# Patient Record
Sex: Female | Born: 1965 | Race: Black or African American | Marital: Married | Smoking: Never smoker
Health system: Southern US, Community
[De-identification: ages and names within clinical notes are randomized; demographics above are authoritative.]

## PROBLEM LIST (undated history)

## (undated) DIAGNOSIS — I82409 Acute embolism and thrombosis of unspecified deep veins of unspecified lower extremity: Secondary | ICD-10-CM

## (undated) HISTORY — DX: Acute embolism and thrombosis of unspecified deep veins of unspecified lower extremity: I82.409

---

## 2012-07-25 ENCOUNTER — Ambulatory Visit
Admission: RE | Admit: 2012-07-25 | Discharge: 2012-07-25 | Disposition: A | Discharge status: Home / Self Care | Payer: No Typology Code available for payment source | Source: Ambulatory Visit | Attending: Specialist | Admitting: Specialist

## 2012-07-25 ENCOUNTER — Other Ambulatory Visit: Payer: Self-pay | Admitting: Specialist

## 2012-07-25 DIAGNOSIS — R7611 Nonspecific reaction to tuberculin skin test without active tuberculosis: Secondary | ICD-10-CM

## 2012-08-18 DIAGNOSIS — I82409 Acute embolism and thrombosis of unspecified deep veins of unspecified lower extremity: Secondary | ICD-10-CM

## 2012-08-18 HISTORY — PX: OTHER SURGICAL HISTORY: SHX169

## 2012-08-18 HISTORY — DX: Acute embolism and thrombosis of unspecified deep veins of unspecified lower extremity: I82.409

## 2012-09-14 ENCOUNTER — Inpatient Hospital Stay (HOSPITAL_COMMUNITY)
Admission: EM | Admit: 2012-09-14 | Discharge: 2012-09-17 | DRG: 253 | Disposition: A | Discharge status: Home Health | Payer: Medicaid Other | Attending: Internal Medicine | Admitting: Internal Medicine

## 2012-09-14 ENCOUNTER — Emergency Department (HOSPITAL_COMMUNITY): Payer: Medicaid Other

## 2012-09-14 ENCOUNTER — Encounter (HOSPITAL_COMMUNITY): Payer: Self-pay | Admitting: Emergency Medicine

## 2012-09-14 DIAGNOSIS — M7989 Other specified soft tissue disorders: Secondary | ICD-10-CM

## 2012-09-14 DIAGNOSIS — I871 Compression of vein: Principal | ICD-10-CM | POA: Diagnosis present

## 2012-09-14 DIAGNOSIS — I82409 Acute embolism and thrombosis of unspecified deep veins of unspecified lower extremity: Secondary | ICD-10-CM

## 2012-09-14 DIAGNOSIS — I824Z9 Acute embolism and thrombosis of unspecified deep veins of unspecified distal lower extremity: Secondary | ICD-10-CM | POA: Diagnosis present

## 2012-09-14 DIAGNOSIS — I82429 Acute embolism and thrombosis of unspecified iliac vein: Secondary | ICD-10-CM | POA: Diagnosis present

## 2012-09-14 DIAGNOSIS — I824Y9 Acute embolism and thrombosis of unspecified deep veins of unspecified proximal lower extremity: Secondary | ICD-10-CM | POA: Diagnosis present

## 2012-09-14 DIAGNOSIS — E876 Hypokalemia: Secondary | ICD-10-CM | POA: Diagnosis present

## 2012-09-14 LAB — APTT: aPTT: 24 seconds (ref 24–37)

## 2012-09-14 LAB — COMPREHENSIVE METABOLIC PANEL
ALT: 14 U/L (ref 0–35)
AST: 18 U/L (ref 0–37)
Albumin: 3.4 g/dL — ABNORMAL LOW (ref 3.5–5.2)
CO2: 22 mEq/L (ref 19–32)
Calcium: 8.6 mg/dL (ref 8.4–10.5)
GFR calc non Af Amer: 88 mL/min — ABNORMAL LOW (ref >90)
Sodium: 134 mEq/L — ABNORMAL LOW (ref 135–145)
Total Protein: 7.1 g/dL (ref 6.0–8.3)

## 2012-09-14 LAB — CBC WITH DIFFERENTIAL/PLATELET
Basophils Absolute: 0 10*3/uL (ref 0.0–0.1)
Basophils Relative: 0 % (ref 0–1)
Eosinophils Relative: 1 % (ref 0–5)
Lymphocytes Relative: 29 % (ref 12–46)
MCV: 75 fL — ABNORMAL LOW (ref 78.0–100.0)
Platelets: 284 10*3/uL (ref 150–400)
RDW: 19.8 % — ABNORMAL HIGH (ref 11.5–15.5)
WBC: 4.6 10*3/uL (ref 4.0–10.5)

## 2012-09-14 MED ORDER — HEPARIN (PORCINE) IN NACL 100-0.45 UNIT/ML-% IJ SOLN
12.0000 [IU]/kg/h | INTRAMUSCULAR | Status: DC
  Administered 2012-09-14: 12 [IU]/kg/h via INTRAVENOUS
  Filled 2012-09-14: qty 250

## 2012-09-14 MED ORDER — HEPARIN BOLUS VIA INFUSION
4000.0000 [IU] | Freq: Once | INTRAVENOUS | Status: AC
  Administered 2012-09-14: 4000 [IU] via INTRAVENOUS

## 2012-09-14 MED ORDER — MORPHINE SULFATE 4 MG/ML IJ SOLN
4.0000 mg | Freq: Once | INTRAMUSCULAR | Status: DC
  Filled 2012-09-14 (×2): qty 1

## 2012-09-14 MED ORDER — DEXTROSE 5 % IV SOLN
2.0000 ug/min | INTRAVENOUS | Status: DC
  Filled 2012-09-14: qty 4

## 2012-09-14 MED ORDER — ONDANSETRON HCL 4 MG/2ML IJ SOLN
4.0000 mg | Freq: Once | INTRAMUSCULAR | Status: DC
  Filled 2012-09-14 (×2): qty 2

## 2012-09-14 MED ORDER — SODIUM CHLORIDE 0.9 % IV BOLUS (SEPSIS)
1000.0000 mL | Freq: Once | INTRAVENOUS | Status: AC
  Administered 2012-09-14: 1000 mL via INTRAVENOUS

## 2012-09-14 MED ORDER — POTASSIUM CHLORIDE CRYS ER 20 MEQ PO TBCR
20.0000 meq | EXTENDED_RELEASE_TABLET | Freq: Three times a day (TID) | ORAL | Status: AC
  Administered 2012-09-14 – 2012-09-15 (×2): 20 meq via ORAL
  Filled 2012-09-14 (×3): qty 1

## 2012-09-14 MED ORDER — FAMOTIDINE IN NACL 20-0.9 MG/50ML-% IV SOLN
20.0000 mg | Freq: Two times a day (BID) | INTRAVENOUS | Status: DC
  Administered 2012-09-15: 20 mg via INTRAVENOUS
  Filled 2012-09-14 (×2): qty 50

## 2012-09-14 MED ORDER — HEPARIN (PORCINE) IN NACL 100-0.45 UNIT/ML-% IJ SOLN
900.0000 [IU]/h | INTRAMUSCULAR | Status: DC
  Filled 2012-09-14: qty 250

## 2012-09-14 MED ORDER — IOHEXOL 350 MG/ML SOLN
100.0000 mL | Freq: Once | INTRAVENOUS | Status: AC | PRN
  Administered 2012-09-14: 100 mL via INTRAVENOUS

## 2012-09-14 MED ORDER — SODIUM CHLORIDE 0.9 % IV SOLN
INTRAVENOUS | Status: DC
  Administered 2012-09-14 – 2012-09-16 (×4): via INTRAVENOUS

## 2012-09-14 MED ORDER — SODIUM CHLORIDE 0.9 % IV SOLN: 250.0000 mL | INTRAVENOUS | Status: DC | PRN

## 2012-09-14 NOTE — ED Provider Notes (Signed)
History     CSN: [id number]  Arrival date & time 09/14/12  1450   First MD Initiated Contact with Patient 09/14/12 1543      Chief Complaint  Patient presents with  . Leg Swelling    Possible insect bite.    (Consider location/radiation/quality/duration/timing/severity/associated sxs/prior treatment) The history is provided by the patient, the spouse, a relative and medical records.    Kirsten Edwards is a 46 y.o. female presents to the emergency department complaining of swelling of the Leg.  The onset of the symptoms was  abrupt starting 2 hours ago.  The patient has associated redness, warmth and shortness of breath, nausea, lightheaded, blurry vision.  The symptoms have been  persistent, rapidly worsened.  nothing makes the symptoms worse and nothing makes symptoms better.  The patient denies fever, chills, headache, neck pain, abdominal pain, dizzy, changes in vision.  Patient speak [place]; her family translating.  States patient stepped outside to talk to toss out a bucket of water. She did step into the grass.  She denies any sort of bite.  Immediately afterwards she began to have swelling of the left leg. Family states that her toes turned black initially and swelling began in the toes and has migrated up her left leg.  She complains about her leg feeling heavy, pain to palpation.  Patient denies falling or trauma.  No Hx of clotting disorders.    History reviewed. No pertinent past medical history.  History reviewed. No pertinent past surgical history.  No family history on file.  History  Substance Use Topics  . Smoking status: Not on file  . Smokeless tobacco: Never Used  . Alcohol Use: No    OB History    Grav Para Term Preterm Abortions TAB SAB Ect Mult Living                  Review of Systems  Constitutional: Negative for fever, diaphoresis, appetite change, fatigue and unexpected weight change.  HENT: Negative for mouth sores and neck stiffness.   Eyes:  Negative for visual disturbance.  Respiratory: Positive for shortness of breath. Negative for cough, chest tightness and wheezing.   Cardiovascular: Positive for leg swelling. Negative for chest pain.  Gastrointestinal: Positive for nausea. Negative for vomiting, abdominal pain, diarrhea and constipation.  Genitourinary: Negative for dysuria, urgency, frequency and hematuria.  Musculoskeletal: Positive for myalgias, joint swelling, arthralgias and gait problem. Negative for back pain.  Skin: Negative for rash.  Neurological: Negative for syncope, light-headedness and headaches.  Psychiatric/Behavioral: Negative for disturbed wake/sleep cycle. The patient is not nervous/anxious.   All other systems reviewed and are negative.    Allergies  Review of patient's allergies indicates no known allergies.  Home Medications   Current Outpatient Rx  Name Route Sig Dispense Refill  . RIFAMPIN 300 MG PO CAPS Oral Take 300 mg by mouth daily.      BP 153/109  Pulse 76  Temp 97.7 F (36.5 C) (Oral)  Resp 22  SpO2 100%  LMP 09/14/2012  Physical Exam  Nursing note and vitals reviewed. Constitutional: She appears well-developed and well-nourished. No distress.  HENT:  Head: Normocephalic and atraumatic.  Mouth/Throat: Oropharynx is clear and moist. No oropharyngeal exudate.  Eyes: Conjunctivae normal are normal. Pupils are equal, round, and reactive to light. No scleral icterus.  Neck: Normal range of motion. Neck supple.  Cardiovascular: Normal rate, regular rhythm, normal heart sounds and intact distal pulses.  Exam reveals no gallop and no friction  rub.   No murmur heard.      Normal heart rate 66 on evaluation.  Patient tachycardic to 133 in triage.  Pulmonary/Chest: Effort normal and breath sounds normal. No respiratory distress. She has no wheezes.  Abdominal: Soft. Bowel sounds are normal. She exhibits no mass. There is no tenderness. There is no rebound and no guarding.    Musculoskeletal: She exhibits edema (L leg from groin to toes) and tenderness (entire left leg).       Decreased range of motion of the left leg secondary to swelling and pain.  Lymphadenopathy:    She has no cervical adenopathy.  Neurological: She is alert.       Speech is clear and goal oriented Moves extremities without ataxia  Skin: Skin is warm and dry. No rash noted. She is not diaphoretic. There is erythema (left leg).       Superficial varicosities noted in the left thigh. No hives  Psychiatric: She has a normal mood and affect.    ED Course  Procedures (including critical care time)  Labs Reviewed  CBC WITH DIFFERENTIAL - Abnormal; Notable for the following:    Hemoglobin 11.2 (*)     HCT 35.1 (*)     MCV 75.0 (*)     MCH 23.9 (*)     RDW 19.8 (*)     All other components within normal limits  COMPREHENSIVE METABOLIC PANEL - Abnormal; Notable for the following:    Sodium 134 (*)     Potassium 3.2 (*)     Glucose, Bld 158 (*)     Albumin 3.4 (*)     GFR calc non Af Amer 88 (*)     All other components within normal limits  PROTIME-INR  APTT   Dg Chest 2 View  09/14/2012  *RADIOLOGY REPORT*  Clinical Data: Leg swelling, ankle swelling  CHEST - 2 VIEW  Comparison: None.  Findings: Normal mediastinum and cardiac silhouette.  Normal pulmonary  vasculature.  No evidence of effusion, infiltrate, or pneumothorax.  No acute bony abnormality.  IMPRESSION: No acute cardiopulmonary process.   Original Report Authenticated By: Genevive Bi, M.D.    Ct Angio Chest Pe W/cm &/or Wo Cm  09/14/2012  *RADIOLOGY REPORT*  Clinical Data: Short of breath, concern for pulmonary embolism, hypotensive  CT ANGIOGRAPHY CHEST  Technique:  Multidetector CT imaging of the chest using the standard protocol during bolus administration of intravenous contrast. Multiplanar reconstructed images including MIPs were obtained and reviewed to evaluate the vascular anatomy.  Contrast:  100 ml Omnipaque  300  Comparison: Chest radiograph 09/14/2012  Findings: There are no filling defects within the pulmonary arteries to suggest acute pulmonary embolism.  No acute findings of the aorta or great vessels.  No pericardial fluid.  Esophagus is normal.  No axillary or supraclavicular lymphadenopathy.  No mediastinal lymphadenopathy.  Review of the lung parenchyma demonstrates no pneumothorax, pleural fluid, or pulmonary edema.  Limited view of the upper abdomen is unremarkable.  Limited view of the skeleton is unremarkable.  IMPRESSION:  1.  No evidence of pulmonary embolism.  2.  No evidence of dissection.  3.  No pneumothorax.   Original Report Authenticated By: Genevive Bi, M.D.     ECG:  Date: 09/14/2012  Rate: 66  Rhythm: normal sinus rhythm  QRS Axis: normal  Intervals: normal  ST/T Wave abnormalities: normal  Conduction Disutrbances:none  Narrative Interpretation: NSR  Old EKG Reviewed: none available    1. DVT (deep venous  thrombosis)    CRITICAL CARE Performed by: Dierdre Forth   Total critical care time: 45  Critical care time was exclusive of separately billable procedures and treating other patients.  Critical care was necessary to treat or prevent imminent or life-threatening deterioration.  Critical care was time spent personally by me on the following activities: development of treatment plan with patient and/or surrogate as well as nursing, discussions with consultants, evaluation of patient's response to treatment, examination of patient, obtaining history from patient or surrogate, ordering and performing treatments and interventions, ordering and review of laboratory studies, ordering and review of radiographic studies, pulse oximetry and re-evaluation of patient's condition.    MDM  Roland Earl resents with unilateral leg swelling. Concern for venous occlusion.  Shortness of breath will look for PE with CT angiogram. Ct angio chest negative for PE.  Venous  duplex: There is acute, extensive, occlusive DVT noted in the left posterior tibial, peroneal, popliteal, femoral and common femoral veins. There is occlusive acute DVT noted in the left greater saphenous vein at origin to mid thigh. There is no propagation to the right lower extremity, noted.  Case discussed with Vascular Surgery who would like IR consulted.  Other labs, including coagulation panel unremarkable.  Pt to be admitted to Critical Care at [facility] with IR procedure.    Dr. Gerhard Munch was consulted, evaluated this patient with me and agrees with the plan.            Dahlia Client Brina Umeda, PA-C 09/14/12 2019

## 2012-09-14 NOTE — Progress Notes (Signed)
12:03 AM  09/15/2012  [facility] MD notified of patient's arrival. Request made to notify IR. IR Notified. E link MD and IR informed that patient does not speak english. Family is at bedside and is able to assist in making patients needs known.   Sherle Poe RN

## 2012-09-14 NOTE — ED Notes (Signed)
[facility] en route.  °

## 2012-09-14 NOTE — ED Notes (Signed)
Report given to ICU RN

## 2012-09-14 NOTE — ED Notes (Signed)
[facility] called for

## 2012-09-14 NOTE — ED Notes (Signed)
Pt. Family members given cup of coffee.

## 2012-09-14 NOTE — ED Notes (Signed)
Pattie, RN at bedside.

## 2012-09-14 NOTE — Progress Notes (Signed)
VASCULAR LAB PRELIMINARY  PRELIMINARY  PRELIMINARY  PRELIMINARY  Left lower extremity venous Doppler completed.    Preliminary report:  There is acute, extensive, occlusive DVT noted in the left posterior tibial, peroneal, popliteal, femoral and common femoral veins.  There is occlusive acute SVT noted in the left greater saphenous vein at origin to mid thigh. There is no propagation to the right lower extremity, noted. Kirsten Edwards, 09/14/2012, 6:23 PM

## 2012-09-14 NOTE — H&P (Signed)
Name: Kirsten Edwards MRN: [id number] DOB: [birth year]    LOS: 0  PULMONARY / CRITICAL CARE MEDICINE  HPI:   46 years old female with no significant PMH. Originally from [place]. Does not speak english, medical history taken by ED physicians from her husband. Apparently she was in her usual state of health until 4:00 pm today when she experienced rapid progressively worse left lower extremity edema. Denies fever, CP, SOB, N/V/D. At admission to the ED and at the time of my examination the patient is awake, alert and hemodynamically stable. CT angiogram negative for PE and normal lung parenchyma. LE dopplers showed extensive, occlusive DVT noted in the left posterior tibial, peroneal, popliteal, femoral and common femoral veins. There is also occlusive acute DVT noted in the left greater saphenous vein at origin to mid thigh. IR was consulted for possible interventional approach. Critical care consulted for admission to the ICU at [facility].     History reviewed. No pertinent past medical history. History reviewed. No pertinent past surgical history. Prior to Admission medications   Medication Sig Start Date End Date Taking? Authorizing Provider  rifampin (RIFADIN) 300 MG capsule Take 300 mg by mouth daily.   Yes Historical Provider, MD   Allergies No Known Allergies  Family History No family history on file. Social History  does not have a smoking history on file. She has never used smokeless tobacco. She reports that she does not drink alcohol or use illicit drugs.  Review Of Systems:  Negative except for what I mentioned in the HPI.   Current Status:  Vital Signs: Temp:  [97.7 F (36.5 C)-98.3 F (36.8 C)] 98.3 F (36.8 C) (09/28 2209) Pulse Rate:  [61-133] 69  (09/28 2209) Resp:  [16-22] 17  (09/28 2209) BP: (73-153)/(47-109) 122/80 mmHg (09/28 2209) SpO2:  [97 %-100 %] 100 % (09/28 2209) Weight:  [150 lb (68.04 kg)] 150 lb (68.04 kg) (09/28 1830)  Physical Examination: General:  No  acute distress Neuro:  Awake, alert, nonfocal HEENT:  PERRL, pink conjunctivae, moist membranes Neck:  Supple, no JVD   Cardiovascular:  RRR, no M/R/G Lungs:  CTA, no W/R/R Abdomen:  Soft, nontender, nondistended, bowel sounds present Musculoskeletal:  Moves all extremities, LLE is slightly cold, edema up to the thigh. Posterior tibial and dorsalis pedis pulses present.  Skin:  No rash  Active Problems:  * No active hospital problems. *    ASSESSMENT AND PLAN  PULMONARY No results found for this basename: PHART:5,PCO2:5,PCO2ART:5,PO2ART:5,HCO3:5,O2SAT:5 in the last 168 hours  A:   1) No PE on CTA 2) No respiratory issues. P:   - O2 via [place] PRN  CARDIOVASCULAR No results found for this basename: TROPONINI:5,LATICACIDVEN:5, O2SATVEN:5,PROBNP:5 in the last 168 hours ECG:  NSR Lines: Peripheral lines  A:  1) Acute LLE DVT with significant clot burden 2) No evidence of compartment syndrome. P:  - Heparin drip - Evaluation by IR for possible embolectomy / catheter directed TPA - Will start coumadin in am - Hypercoagulable panel ordered.  RENAL  Lab 09/14/12 1625  NA 134*  K 3.2*  CL 100  CO2 22  BUN 6  CREATININE 0.80  CALCIUM 8.6  MG --  PHOS --   Intake/Output    None     A:   1) Hypokalemia P:   Will replenish K+ orally  GASTROINTESTINAL  Lab 09/14/12 1625  AST 18  ALT 14  ALKPHOS 55  BILITOT 0.3  PROT 7.1  ALBUMIN 3.4*    A:  1) No issues. P:   1) Pepcid BID  HEMATOLOGIC  Lab 09/14/12 1625  HGB 11.2*  HCT 35.1*  PLT 284  INR 1.08  APTT 24   A:   1) No issues P:  - Monitor CBC  INFECTIOUS  Lab 09/14/12 1625  WBC 4.6  PROCALCITON --    A:   1) No evidence of infection P:   - No antibiotics  ENDOCRINE No results found for this basename: GLUCAP:5 in the last 168 hours A:   1) No history of DM     NEUROLOGIC  A:   1) No issues   BEST PRACTICE / DISPOSITION - Level of Care:  ICU - Primary Service:  PCCM -  Consultants:  IR - Code Status:  Full code - Diet:  NPO - DVT Px:  Heparin drip - GI Px:  Pepcid - Skin Integrity:  Intact   The patient is critically ill with multiple organ systems failure and requires high complexity decision making for assessment and support, frequent evaluation and titration of therapies, application of advanced monitoring technologies and extensive interpretation of multiple databases.   Critical Care Time devoted to patient care services described in this note is: 1 Hour  Overton Mam, M.D. Pulmonary and Critical Care Medicine [facility] Pager: [phone]  09/14/2012, 10:45 PM

## 2012-09-14 NOTE — ED Notes (Signed)
Bed:RESB<BR> Expected date:<BR> Expected time:<BR> Means of arrival:<BR> Comments:<BR>

## 2012-09-14 NOTE — Progress Notes (Signed)
ANTICOAGULATION CONSULT NOTE - Initial Consult  Pharmacy Consult for Heparin Indication: DVT  No Known Allergies  Patient Measurements: Height: 5\' 4"  (162.6 cm) Weight: 150 lb (68.04 kg) IBW/kg (Calculated) : 54.7  Heparin Dosing Weight:   Vital Signs: Temp: 97.7 F (36.5 C) (09/28 1457) Temp src: Oral (09/28 1457) BP: 153/109 mmHg (09/28 1457) Pulse Rate: 68  (09/28 1830)  Labs:  Basename 09/14/12 1625  HGB 11.2*  HCT 35.1*  PLT 284  APTT 24  LABPROT 13.9  INR 1.08  HEPARINUNFRC --  CREATININE 0.80  CKTOTAL --  CKMB --  TROPONINI --    Estimated Creatinine Clearance: 85 ml/min (by C-G formula based on Cr of 0.8).   Medical History: History reviewed. No pertinent past medical history.  Medications:  Scheduled:    . heparin  4,000 Units Intravenous Once  . morphine  4 mg Intravenous Once  . ondansetron (ZOFRAN) IV  4 mg Intravenous Once  . sodium chloride  1,000 mL Intravenous Once   Infusions:    . heparin    . norepinephrine (LEVOPHED) Adult infusion      Assessment: 46 yo F presents to [facility] ED on 09/14/12, found to have new LLE DVT.  Chest CT (-) for PE. ER MD has started IV heparin: 4,000 units IV bolus x1, then start at 12 units/kr/hr and has asked Pharmacy to take over further management. Baseline coags wnl.   Goal of Therapy:  Heparin level 0.3-0.7 units/ml Monitor platelets by anticoagulation protocol: Yes   Plan:  1) Continue heparin orders per MD: 4000 unit IV bolus x1, the heparin at 800 units/hr. 2) Heparin level 6 hours after #1 started 3) Daily CBC 4) F/U updated weight info (150 lbs was guestimate by ED staff)  Darrol Angel, PharmD Pager: [phone] 09/14/2012,6:56 PM

## 2012-09-14 NOTE — ED Notes (Signed)
Bedside report received from previous RN 

## 2012-09-14 NOTE — ED Notes (Signed)
Jeraldine Loots, MD and PA at bedside.

## 2012-09-14 NOTE — ED Notes (Addendum)
Pt reports that a possible insect bite at 1430 to the left leg. Left leg swelling is noted, 9/10 pain, and superficial veins apparent. Cap refill is <3 seconds. Moderate pedal pulses present. Pt denies any recent travel.

## 2012-09-15 ENCOUNTER — Inpatient Hospital Stay (HOSPITAL_COMMUNITY): Payer: Medicaid Other

## 2012-09-15 DIAGNOSIS — I82409 Acute embolism and thrombosis of unspecified deep veins of unspecified lower extremity: Secondary | ICD-10-CM

## 2012-09-15 LAB — HOMOCYSTEINE: Homocysteine: 5.7 umol/L (ref 4.0–15.4)

## 2012-09-15 LAB — HEPARIN LEVEL (UNFRACTIONATED)
Heparin Unfractionated: 0.28 IU/mL — ABNORMAL LOW (ref 0.30–0.70)
Heparin Unfractionated: 0.25 IU/mL — ABNORMAL LOW (ref 0.30–0.70)

## 2012-09-15 LAB — BASIC METABOLIC PANEL
GFR calc non Af Amer: >90 mL/min (ref >90)
Glucose, Bld: 87 mg/dL (ref 70–99)
Potassium: 3.6 mEq/L (ref 3.5–5.1)
Sodium: 137 mEq/L (ref 135–145)

## 2012-09-15 LAB — FIBRINOGEN: Fibrinogen: 254 mg/dL (ref 204–475)

## 2012-09-15 LAB — CBC
Hemoglobin: 9.3 g/dL — ABNORMAL LOW (ref 12.0–15.0)
MCHC: 31.6 g/dL (ref 30.0–36.0)
Platelets: 216 10*3/uL (ref 150–400)
RBC: 3.91 MIL/uL (ref 3.87–5.11)

## 2012-09-15 LAB — MRSA PCR SCREENING: MRSA by PCR: NEGATIVE

## 2012-09-15 MED ORDER — HYDROCODONE-ACETAMINOPHEN 5-325 MG PO TABS
1.0000 | ORAL_TABLET | ORAL | Status: DC | PRN
  Administered 2012-09-15 (×2): 1 via ORAL
  Filled 2012-09-15 (×2): qty 1

## 2012-09-15 MED ORDER — IODIXANOL 320 MG/ML IV SOLN
100.0000 mL | Freq: Once | INTRAVENOUS | Status: AC | PRN
  Administered 2012-09-15: 140 mL via INTRAVENOUS

## 2012-09-15 MED ORDER — SENNOSIDES-DOCUSATE SODIUM 8.6-50 MG PO TABS
1.0000 | ORAL_TABLET | Freq: Every evening | ORAL | Status: DC | PRN
  Filled 2012-09-15: qty 1

## 2012-09-15 MED ORDER — IODIXANOL 320 MG/ML IV SOLN: 100.0000 mL | Freq: Once | INTRAVENOUS | Status: AC | PRN

## 2012-09-15 MED ORDER — HEPARIN SODIUM (PORCINE) 1000 UNIT/ML IJ SOLN
INTRAMUSCULAR | Status: AC
  Administered 2012-09-15: 1000 [IU]
  Filled 2012-09-15: qty 1

## 2012-09-15 MED ORDER — HEPARIN (PORCINE) IN NACL 100-0.45 UNIT/ML-% IJ SOLN
1250.0000 [IU]/h | INTRAMUSCULAR | Status: DC
  Administered 2012-09-15: 900 [IU]/h via INTRAVENOUS
  Administered 2012-09-16: 1100 [IU]/h via INTRAVENOUS
  Filled 2012-09-15 (×3): qty 250

## 2012-09-15 MED ORDER — MIDAZOLAM HCL 5 MG/5ML IJ SOLN
INTRAMUSCULAR | Status: AC | PRN
  Administered 2012-09-15 (×4): 1 mg via INTRAVENOUS

## 2012-09-15 MED ORDER — SODIUM CHLORIDE 0.9 % IV SOLN
10.0000 mg | INTRAVENOUS | Status: AC
  Administered 2012-09-15: 10 mg via INTRAVENOUS
  Filled 2012-09-15: qty 10

## 2012-09-15 MED ORDER — DOCUSATE SODIUM 100 MG PO CAPS
100.0000 mg | ORAL_CAPSULE | Freq: Two times a day (BID) | ORAL | Status: DC
  Administered 2012-09-15 – 2012-09-17 (×5): 100 mg via ORAL
  Filled 2012-09-15 (×7): qty 1

## 2012-09-15 MED ORDER — ONDANSETRON HCL 4 MG/2ML IJ SOLN: 4.0000 mg | Freq: Four times a day (QID) | INTRAMUSCULAR | Status: DC | PRN

## 2012-09-15 MED ORDER — FENTANYL CITRATE 0.05 MG/ML IJ SOLN
INTRAMUSCULAR | Status: AC | PRN
  Administered 2012-09-15 (×6): 25 ug via INTRAVENOUS

## 2012-09-15 NOTE — Progress Notes (Signed)
ANTICOAGULATION CONSULT NOTE - Follow Up Consult  Pharmacy Consult for Heparin  Indication: DVT and s/p thrombolysis/thrombectomy, May Thurner Syndrome  No Known Allergies  Patient Measurements: Height: 5\' 4"  (162.6 cm) Weight: 150 lb (68.04 kg) IBW/kg (Calculated) : 54.7  Heparin Dosing Weight: 68 Kg  Vital Signs: Temp: 98.2 F (36.8 C) (09/29 2000) Temp src: Oral (09/29 2000) BP: 124/75 mmHg (09/29 2000) Pulse Rate: 67  (09/29 2000)  Labs:  Alvira Philips 09/15/12 2022 09/15/12 0254 09/14/12 1625  HGB -- 9.3* 11.2*  HCT -- 29.4* 35.1*  PLT -- 216 284  APTT -- -- 24  LABPROT -- -- 13.9  INR -- -- 1.08  HEPARINUNFRC 0.25* 0.28* --  CREATININE -- 0.71 0.80  CKTOTAL -- -- --  CKMB -- -- --  TROPONINI -- -- --    Assessment: Kirsten Edwards was admitted with extensive LLE DVT, now s/p catheter directed thrombolysis/thrombectomy of femoral, common femoral and iliac vein 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 Heparin level is slightly below goal, no bleeding complications reported.  Goal of Therapy:  Heparin level 0.3-0.7 units/ml Monitor platelets by anticoagulation protocol: Yes   Plan:  - Increase heparin to 1100 units/hr - Recheck heparin level at 0300, CBC at that time - Daily heparin level and CBC - Will anticipate starting Coumadin tomorrow, if ordered by MD  Thanks, Roshon Duell K. Allena Katz, PharmD, BCPS.  Clinical Pharmacist Pager [phone]. 09/15/2012 9:11 PM

## 2012-09-15 NOTE — Consult Note (Addendum)
Interventional Radiology Consult Note  Reason for Consult: Acute massive LLE DVT, evaluate for thrombolysis Referring Physician: Erlinda Hong, MD  CC: Left leg pain and swelling   HPI: Kirsten Edwards is an 46 y.o. female with no significant PMH who was in her usual state of health today until approximately 16:00 when she began to devlope rapidly progressive LLE swelling and discomfort.  She present to the ER at [facility] where a duplex US showed extensive deep and superficial venous thrombus throughout the LLE.  She was systemically heparinized and Interventional Radiology consulted for possible thrombolysis/thrombectomy.  I requested the pt be transferred to [facility] where we have the device capabilities to treat her condition.    She is currently feeling incrementally improved since her initial presentation. Her pain is more of a discomfort and aching.  She denies loss of sensation or paresthesias, chest pain, shortness of breath or palpitations.  Denies history of recent trauma, surgery, or malignancy. She does not speak [place], all history conducted through her husband who is at the bedside and assisted with interpretation.     Past Medical History: History reviewed. No pertinent past medical history.  Surgical History: History reviewed. No pertinent past surgical history.  Family History: No family history on file.  Social History:  does not have a smoking history on file. She has never used smokeless tobacco. She reports that she does not drink alcohol or use illicit drugs.  Allergies: No Known Allergies  Medications: I have reviewed the patient's current medications.  ROS: See HPI for pertinent findings, otherwise complete 10 system review negative.  Physical Exam: Blood pressure 126/77, pulse 62, temperature 98.1 F (36.7 C), temperature source Oral, resp. rate 21, height 5\' 4"  (1.626 m), weight 150 lb (68.04 kg), last menstrual period 09/14/2012, SpO2 100.00%.  General: Pleasant  AAF in NAD CV: RRR, no m/r/g Pulm: CTA B Extremities: LLE edematous, approximately 1.5x the circumference of the normal right leg.  Pt is neurovascularly intact, sensation and strength intact distally.  The foot is warm and perfused.    Labs: CBC  Basename 09/15/12 0254 09/14/12 1625  WBC 7.7 4.6  HGB 9.3* 11.2*  HCT 29.4* 35.1*  PLT 216 284   MET  Basename 09/15/12 0254 09/14/12 1625  NA 137 134*  K 3.6 3.2*  CL 106 100  CO2 22 22  GLUCOSE 87 158*  BUN 5* 6  CREATININE 0.71 0.80  CALCIUM 7.9* 8.6    Basename 09/14/12 1625  PROT 7.1  ALBUMIN 3.4*  AST 18  ALT 14  ALKPHOS 55  BILITOT 0.3  BILIDIR --  IBILI --  LIPASE --   PT/INR  Basename 09/14/12 1625  LABPROT 13.9  INR 1.08   ABG No results found for this basename: PHART:2,PCO2:2,PO2:2,HCO3:2 in the last 72 hours    Dg Chest 2 View  09/14/2012  *RADIOLOGY REPORT*  Clinical Data: Leg swelling, ankle swelling  CHEST - 2 VIEW  Comparison: None.  Findings: Normal mediastinum and cardiac silhouette.  Normal pulmonary  vasculature.  No evidence of effusion, infiltrate, or pneumothorax.  No acute bony abnormality.  IMPRESSION: No acute cardiopulmonary process.   Original Report Authenticated By: Genevive Bi, M.D.    Ct Angio Chest Pe W/cm &/or Wo Cm  09/14/2012  *RADIOLOGY REPORT*  Clinical Data: Short of breath, concern for pulmonary embolism, hypotensive  CT ANGIOGRAPHY CHEST  Technique:  Multidetector CT imaging of the chest using the standard protocol during bolus administration of intravenous contrast. Multiplanar reconstructed images  including MIPs were obtained and reviewed to evaluate the vascular anatomy.  Contrast:  100 ml Omnipaque 300  Comparison: Chest radiograph 09/14/2012  Findings: There are no filling defects within the pulmonary arteries to suggest acute pulmonary embolism.  No acute findings of the aorta or great vessels.  No pericardial fluid.  Esophagus is normal.  No axillary or  supraclavicular lymphadenopathy.  No mediastinal lymphadenopathy.  Review of the lung parenchyma demonstrates no pneumothorax, pleural fluid, or pulmonary edema.  Limited view of the upper abdomen is unremarkable.  Limited view of the skeleton is unremarkable.  IMPRESSION:  1.  No evidence of pulmonary embolism.  2.  No evidence of dissection.  3.  No pneumothorax.   Original Report Authenticated By: Genevive Bi, M.D.     Assessment/Plan: 46 yo female with acute massive LLE DVT and extensive left lower extremity swelling. Her limb is not acutely threatened, I see no signs of phlegmasia, however she is at high risk (50%) for developing post thrombotic syndrome (PTS) in her left lower extremity over the next 5-10 years given the extent of her thrombus and the fact that it extends proximally into the common femoral and likely iliac veins.    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   With best medical therapy (6 mo anticoagulation and 30 mmHg thigh compression hose daily x 2 years), the risk of PTS can be decreased by about 50% to 25%.  With the addition of catheter directed thrombolysis/thrombectomy to remove the obstructing thrombus in the acute phase, the risk of PTS can be decreased an additional 50% to about 10-12.5%.  The diagnosis and treatment options were discussed with the patient and her husband.  The potential risks of bleeding, infection and thrombolytic therapy including a <2% risk of stroke or severe bleeding complication were discussed.  The family understands and wishes to proceed.  - Maintain NPO status - Draw serum fibrinogen - Will bring down to IR this morning for thrombolysis/thrombectmy and possible PTA/stenting.  May need to lyse overnight if  thrombus is resistant.  Signed,  Sterling Big, MD Vascular & Interventional Radiologist [facility] Radiology  09/15/2012, 6:51 AM

## 2012-09-15 NOTE — Progress Notes (Signed)
ANTICOAGULATION CONSULT NOTE - Follow Up Consult  Pharmacy Consult for heparin Indication: DVT  Labs:  [facility] 09/15/12 0254 09/14/12 1625  HGB 9.3* 11.2*  HCT 29.4* 35.1*  PLT 216 284  APTT -- 24  LABPROT -- 13.9  INR -- 1.08  HEPARINUNFRC 0.28* --  CREATININE -- 0.80  CKTOTAL -- --  CKMB -- --  TROPONINI -- --    Assessment: 46yo female slightly subtherapeutic on heparin with initial dosing for DVT.  Goal of Therapy:  Heparin level 0.3-0.7 units/ml   Plan:  Will increase heparin gtt by 1-2 units/kg/hr to 900 units/hr and check level in 6hr.  Colleen Can PharmD BCPS 09/15/2012,3:42 AM

## 2012-09-15 NOTE — Progress Notes (Signed)
ANTICOAGULATION CONSULT NOTE - Follow Up Consult  Pharmacy Consult for Heparin  Indication: DVT and s/p thrombolysis/thrombectomy, May Thurner Syndrome  No Known Allergies  Patient Measurements: Height: 5\' 4"  (162.6 cm) Weight: 150 lb (68.04 kg) IBW/kg (Calculated) : 54.7  Heparin Dosing Weight: 68 Kg  Vital Signs: Temp: 98.1 F (36.7 C) (09/29 0801) Temp src: Oral (09/29 0801) BP: 144/91 mmHg (09/29 1215) Pulse Rate: 64  (09/29 1215)  Labs:  Basename 09/15/12 0254 09/14/12 1625  HGB 9.3* 11.2*  HCT 29.4* 35.1*  PLT 216 284  APTT -- 24  LABPROT -- 13.9  INR -- 1.08  HEPARINUNFRC 0.28* --  CREATININE 0.71 0.80  CKTOTAL -- --  CKMB -- --  TROPONINI -- --    Estimated Creatinine Clearance: 85 ml/min (by C-G formula based on Cr of 0.71).   Medications:  Scheduled:    . alteplase (TPA-ACTIVASE) *DIALYSIS CATH* 10 mg infusion  10 mg Intravenous to XRAY  . famotidine (PEPCID) IV  20 mg Intravenous Q12H  . heparin      . heparin  4,000 Units Intravenous Once  . morphine  4 mg Intravenous Once  . ondansetron (ZOFRAN) IV  4 mg Intravenous Once  . potassium chloride  20 mEq Oral TID  . sodium chloride  1,000 mL Intravenous Once    Assessment: Ms. 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 BL INR 1.08. H/H and plt decr from baseline-? Dilution vs. Consumptive vs. loss with intervention. All drips currently off, spoke to Dr. Archer Asa at bedside, he is ok with resuming heparin at ~3pm today, and Coumadin possibly tomorrow.   Goal of Therapy:  Heparin level 0.3-0.7 units/ml Monitor platelets by anticoagulation protocol: Yes   Plan:  - At 3pm, restart heparin infusion at 900 units/hr- no bolus post-procedure -  Will check heparin level at 2100 tonight - Daily heparin level and CBC - Will anticipate starting Coumadin tomorrow, if ordered by MD  Thanks, Oval Cavazos K. Allena Katz, PharmD, BCPS.  Clinical Pharmacist Pager [phone]. 09/15/2012 12:56 PM

## 2012-09-15 NOTE — Progress Notes (Signed)
Name: Keniah Klemmer MRN: [id number] DOB: 08-01-1966  ELECTRONIC ICU PHYSICIAN NOTE  Problem:  Chest pain.  CT angio negative for PE.  On Heparin gtt.  Intervention:  12-lead ECG.  Troponin.  Orlean Bradford, M.D. Pulmonary and Critical Care Medicine [facility] Cell: [phone] Pager: [phone]  09/15/2012, 7:47 PM

## 2012-09-15 NOTE — Procedures (Signed)
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   - I will have her follow with me in clinic  Signed,  Sterling Big, MD Vascular & Interventional Radiologist [facility]

## 2012-09-15 NOTE — ED Notes (Signed)
Opened chart to add late VS

## 2012-09-15 NOTE — Progress Notes (Signed)
Name: Aleenah Homen MRN: [id number] DOB: [birth year]    LOS: 1  PULMONARY / CRITICAL CARE MEDICINE  HPI:   46 years old female with no significant PMH. Originally from [place]. Does not speak english, medical history taken by ED physicians from her husband. Apparently she was in her usual state of health until 4:00 pm 9/28  when she experienced rapid progressively worse left lower extremity edema. CT angiogram negative for PE and normal lung parenchyma. LE dopplers showed extensive, occlusive DVT noted in the left posterior tibial, peroneal, popliteal, femoral and common femoral veins. There was also occlusive acute DVT noted in the left greater saphenous vein at origin to mid thigh. IR was consulted for possible interventional approach. Critical care consulted for admission to the ICU at [facility].   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   Current Status: no pain, dyspnea  Vital Signs: Temp:  [97.7 F (36.5 C)-98.3 F (36.8 C)] 98.1 F (36.7 C) (09/29 0801) Pulse Rate:  [54-133] 70  (09/29 1207) Resp:  [15-24] 21  (09/29 1207) BP: (63-166)/(42-109) 155/101 mmHg (09/29 1207) SpO2:  [97 %-100 %] 100 % (09/29 1207) Weight:  [68.04 kg (150 lb)] 68.04 kg (150 lb) (09/28 1830)  Physical Examination: General:  No acute distress Neuro:  Awake, alert, nonfocal HEENT:  PERRL, pink conjunctivae, moist membranes Neck:  Supple, no JVD   Cardiovascular:  RRR, no M/R/G Lungs:  CTA, no W/R/R Abdomen:  Soft, nontender, nondistended, bowel sounds present Musculoskeletal:  Moves all extremities, LLE is slightly cold, edema up to the thigh. Posterior tibial and dorsalis pedis pulses present.  Skin:  No rash  Active Problems:  * No active hospital problems. *    ASSESSMENT AND PLAN  PULMONARY No results found for this basename:  PHART:5,PCO2:5,PCO2ART:5,PO2ART:5,HCO3:5,O2SAT:5 in the last 168 hours  A:   1) No PE on CTA 2) No respiratory issues. P:   - O2 via [place] PRN  CARDIOVASCULAR No results found for this basename: TROPONINI:5,LATICACIDVEN:5, O2SATVEN:5,PROBNP:5 in the last 168 hours ECG:  NSR Lines: Peripheral lines  A:  1) Acute LLE DVT with significant clot burden 2) No evidence of compartment syndrome. P:  -see heme section  RENAL  Lab 09/15/12 0254 09/14/12 1625  NA 137 134*  K 3.6 3.2*  CL 106 100  CO2 22 22  BUN 5* 6  CREATININE 0.71 0.80  CALCIUM 7.9* 8.6  MG -- --  PHOS -- --   Intake/Output      09/28 0701 - 09/29 0700 09/29 0701 - 09/30 0700   I.V. (mL/kg) 976 (14.3) 200 (2.9)   Total Intake(mL/kg) 976 (14.3) 200 (2.9)   Net +976 +200          A:   1) Hypokalemia (resolved) P:   Monitor   HEMATOLOGIC  Lab 09/15/12 0254 09/14/12 1625  HGB 9.3* 11.2*  HCT 29.4* 35.1*  PLT 216 284  INR -- 1.08  APTT -- 24   A:   1) Left LE DVT S/p TPA for thrombolysis 9/29 P:  Heparin gtt Start coumadin when ok with IR  OK to transfer to tele  09/15/2012, 12:12 PM   Cyril Mourning MD. FCCP. [place] Pul[facility] & Critical [facility] Pager [phone] If no response call [phone]

## 2012-09-15 NOTE — Progress Notes (Signed)
Stent card given to pt's husband.

## 2012-09-15 NOTE — ED Provider Notes (Signed)
This was a shared patient encounter.  This 46 year old female, previously well, now presenting with the acute onset of pain and edema in her left lower extremity with chest pain, shortness of breath was immediately concerning for thromboembolic phenomena, though infectious etiology or other inflammatory conditions were considered.  Given the acuity of her change she had a emergent CAT scan of her chest which was negative for pulmonary embolism.  She subsequently had ultrasound of her lower extremities which demonstrated diffuse thrombosis throughout nearly all veins of the left lower extremity.  Given the acuity, I spoke with vascular surgery, interventional radiology, critical care, to expedite the patient's treatment.  She was started on heparin.  [facility], with expectation of IR evaluation once there. Complication the patient's presentation was possible hypotension that developed after her initial presentation.  This lasted for approximately 30 minutes, though without significant change in the patients clinical status.  The blood pressure improved with fluids, and prior to transfer was not.an issue again.  I reviewed the radiographic studies, both ultrasound and CT myself.  I discussed all findings with the patient's family members who speak [place].  They translated for the patient.  I saw the ECG, relevant labs and studies - I agree with the interpretation.  CRITICAL CARE Performed by: Gerhard Munch   Total critical care time: 45  Critical care time was exclusive of separately billable procedures and treating other patients.  Critical care was necessary to treat or prevent imminent or life-threatening deterioration.  Critical care was time spent personally by me on the following activities: development of treatment plan with patient and/or surrogate as well as nursing, discussions with consultants, evaluation of patient's response to treatment, examination of  patient, obtaining history from patient or surrogate, ordering and performing treatments and interventions, ordering and review of laboratory studies, ordering and review of radiographic studies, pulse oximetry and re-evaluation of patient's condition.   Gerhard Munch, MD 09/15/12 0006

## 2012-09-15 NOTE — Progress Notes (Signed)
CRITICAL VALUE ALERT  Critical value received:  Troponin 0.63  Date of notification:  09/15/2012  Time of notification:  21:23  Critical value read back:yes  Nurse who received alert:  Levander Campion RN  MD notified (1st page):  Dr. Marin Shutter  Time of first page:  21:25  MD notified (2nd page):  Time of second page:  Responding MD:  Dr. Marin Shutter  Time MD responded:  21:25

## 2012-09-16 ENCOUNTER — Other Ambulatory Visit (HOSPITAL_COMMUNITY): Payer: Self-pay

## 2012-09-16 LAB — CARDIOLIPIN ANTIBODIES, IGG, IGM, IGA: Anticardiolipin IgG: 9 GPL U/mL — ABNORMAL LOW (ref <23)

## 2012-09-16 LAB — CBC
Platelets: 194 10*3/uL (ref 150–400)
RBC: 3.74 MIL/uL — ABNORMAL LOW (ref 3.87–5.11)
WBC: 5.5 10*3/uL (ref 4.0–10.5)

## 2012-09-16 LAB — LUPUS ANTICOAGULANT PANEL
PTT Lupus Anticoagulant: 62.9 secs — ABNORMAL HIGH (ref 28.0–43.0)
PTTLA Confirmation: 5.2 secs (ref <8.0)

## 2012-09-16 LAB — BETA-2-GLYCOPROTEIN I ABS, IGG/M/A
Beta-2 Glyco I IgG: 0 G Units (ref <20)
Beta-2-Glycoprotein I IgM: 3 M Units (ref <20)

## 2012-09-16 LAB — PROTEIN C ACTIVITY: Protein C Activity: 135 % — ABNORMAL HIGH (ref 75–133)

## 2012-09-16 LAB — FACTOR 5 LEIDEN

## 2012-09-16 LAB — HEPARIN LEVEL (UNFRACTIONATED)
Heparin Unfractionated: 0.29 IU/mL — ABNORMAL LOW (ref 0.30–0.70)
Heparin Unfractionated: 0.59 IU/mL (ref 0.30–0.70)
Heparin Unfractionated: 0.76 IU/mL — ABNORMAL HIGH (ref 0.30–0.70)

## 2012-09-16 LAB — TROPONIN I
Troponin I: 0.33 ng/mL (ref <0.30)
Troponin I: <0.3 ng/mL (ref <0.30)

## 2012-09-16 LAB — PROTEIN S, TOTAL: Protein S Ag, Total: 97 % (ref 60–150)

## 2012-09-16 LAB — PROTEIN C, TOTAL: Protein C, Total: 87 % (ref 72–160)

## 2012-09-16 MED ORDER — HEPARIN (PORCINE) IN NACL 100-0.45 UNIT/ML-% IJ SOLN
1150.0000 [IU]/h | INTRAMUSCULAR | Status: DC
  Administered 2012-09-17: 1150 [IU]/h via INTRAVENOUS
  Filled 2012-09-16 (×2): qty 250

## 2012-09-16 NOTE — Progress Notes (Signed)
Name: Kirsten Edwards MRN: [id number] DOB: October 19, 1966    LOS: 2  PULMONARY / CRITICAL CARE MEDICINE  HPI:   46 years old female with no significant PMH. Originally from [place]. Does not speak english, medical history taken by ED physicians from her husband. Apparently she was in her usual state of health until 4:00 pm 9/28  when she experienced rapid progressively worse left lower extremity edema. CT angiogram negative for PE and normal lung parenchyma. LE dopplers showed extensive, occlusive DVT noted in the left posterior tibial, peroneal, popliteal, femoral and common femoral veins. There was also occlusive acute DVT noted in the left greater saphenous vein at origin to mid thigh. IR was consulted for possible interventional approach. Critical care consulted for admission to the ICU at [facility].   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 Posterior tibial and dorsalis pedis pulses present.  Skin:  No rash  Active Problems:  * No active hospital problems. *    ASSESSMENT AND PLAN  PULMONARY No results found for this basename: PHART:5,PCO2:5,PCO2ART:5,PO2ART:5,HCO3:5,O2SAT:5 in the last 168 hours  A:   1) No PE on CTA 2) No respiratory issues. P:   - O2 via  [place], not needed -ambulation IS if in bed  CARDIOVASCULAR  Lab 09/16/12 0745 09/16/12 0243 09/15/12 2022  TROPONINI <0.30 0.33* 0.63*  LATICACIDVEN -- -- --  PROBNP -- -- --   ECG:  NSR Lines: Peripheral lines  A:  1) Acute LLE DVT with significant clot burden 2) No evidence of compartment syndrome. P:  -see heme section Tele maintain Heparin Ct chest reviewed  RENAL  Lab 09/15/12 0254 09/14/12 1625  NA 137 134*  K 3.6 3.2*  CL 106 100  CO2 22 22  BUN 5* 6  CREATININE 0.71 0.80  CALCIUM 7.9* 8.6  MG -- --  PHOS -- --   Intake/Output      09/29 0701 - 09/30 0700 09/30 0701 - 10/01 0700   P.O. 240    I.V. (mL/kg) 2953 (42.7) 975 (14.1)   IV Piggyback 50    Total Intake(mL/kg) 3243 (46.9) 975 (14.1)   Urine (mL/kg/hr) 1425 (0.9)    Total Output 1425    Net +1818 +975        Urine Occurrence 1 x 2 x   Stool Occurrence 1 x 1 x     A:   1) Hypokalemia (resolved) P:   kvo Chem reviewed Bun reveals well hydrated  HEMATOLOGIC  Lab 09/16/12 0243 09/15/12 0254 09/14/12 1625  HGB 9.1* 9.3* 11.2*  HCT 28.3* 29.4* 35.1*  PLT 194 216 284  INR 3.16* -- 1.08  APTT -- -- 24   A:   1) Left LE DVT S/p TPA  for thrombolysis 9/29 P:  Heparin gtt consider start coumadin when there hep level   To triad, call if needed  Mcarthur Rossetti. Tyson Alias, MD, FACP Pgr: [phone]  Pulmonary & Critical Care

## 2012-09-16 NOTE — Progress Notes (Signed)
At shift change (approx. 19:30) patient complained of new onset chest pain with inspiration.  Patient indicated that chest pain was mid-sternal and rated the pain as an 8 on a scale of 0-10.  MD on call made aware, new orders received.  Will continue to monitor.

## 2012-09-16 NOTE — Progress Notes (Signed)
ANTICOAGULATION CONSULT NOTE - Follow Up Consult  Pharmacy Consult for Heparin  Indication: DVT and s/p thrombolysis/thrombectomy, May Thurner Syndrome  No Known Allergies  Patient Measurements: Height: 5\' 4"  (162.6 cm) Weight: 150 lb (68.04 kg) IBW/kg (Calculated) : 54.7  Heparin Dosing Weight: 68 Kg  Vital Signs: Temp: 98.7 F (37.1 C) (09/30 0025) Temp src: Oral (09/30 0025) BP: 110/69 mmHg (09/30 0200) Pulse Rate: 77  (09/30 0200)  Labs:  Basename 09/16/12 0243 09/15/12 2022 09/15/12 0254 09/14/12 1625  HGB 9.1* -- 9.3* --  HCT 28.3* -- 29.4* 35.1*  PLT 194 -- 216 284  APTT -- -- -- 24  LABPROT -- -- -- 13.9  INR -- -- -- 1.08  HEPARINUNFRC 0.29* 0.25* 0.28* --  CREATININE -- -- 0.71 0.80  CKTOTAL -- -- -- --  CKMB -- -- -- --  TROPONINI -- 0.63* -- --    Assessment: Ms. 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 Heparin level is slightly below goal, infusing without problem at 1100 units/hr.  Patient noted by RN to have hematuria, not significant or concerning. H/H trend slightly down but also on IV fluids.  Goal of Therapy:  Heparin level 0.3-0.7 units/ml Monitor platelets by anticoagulation protocol: Yes   Plan:  - Increase heparin to 1250 units/hr - Recheck heparin level in 6h - Daily heparin level and CBC - Will anticipate starting Coumadin tomorrow, if ordered by MD   Thank you for allowing pharmacy to be a part of this patients care team.  Lovenia Kim Pharm.D., BCPS Clinical Pharmacist 09/16/2012 4:10 AM Pager: [phone] Phone: [phone]

## 2012-09-16 NOTE — Progress Notes (Signed)
ANTICOAGULATION CONSULT NOTE - Follow Up Consult  Pharmacy Consult for Heparin  Indication: DVT and s/p thrombolysis/thrombectomy, May Thurner Syndrome  No Known Allergies  Patient Measurements: Height: 5\' 4"  (162.6 cm) Weight: 152 lb 8.9 oz (69.2 kg) IBW/kg (Calculated) : 54.7  Heparin Dosing Weight: 68 Kg  Vital Signs: Temp: 98.4 F (36.9 C) (09/30 0742) Temp src: Oral (09/30 0742) BP: 126/73 mmHg (09/30 0900) Pulse Rate: 72  (09/30 0900)  Labs:  Kirsten Edwards 09/16/12 0956 09/16/12 0745 09/16/12 0243 09/15/12 2022 09/15/12 0254 09/14/12 1625  HGB -- -- 9.1* -- 9.3* --  HCT -- -- 28.3* -- 29.4* 35.1*  PLT -- -- 194 -- 216 284  APTT -- -- -- -- -- 24  LABPROT -- -- 30.7* -- -- 13.9  INR -- -- 3.16* -- -- 1.08  HEPARINUNFRC 0.59 -- 0.29* 0.25* -- --  CREATININE -- -- -- -- 0.71 0.80  CKTOTAL -- -- -- -- -- --  CKMB -- -- -- -- -- --  TROPONINI -- <0.30 0.33* 0.63* -- --    Assessment: Kirsten Edwards was admitted with extensive LLE DVT, now s/p catheter directed thrombolysis/thrombectomy of femoral, common femoral and iliac vein 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 Heparin level is now at goal after dose increase this AM.  Noted by RN to have hematuria, not significant or concerning. CBC stable.  Goal of Therapy:  Heparin level 0.3-0.7 units/ml Monitor platelets by anticoagulation protocol: Yes   Plan:  - Continue heparin at 1250 units/hr - Recheck heparin level in 6h to confirm dosing - Daily heparin level and CBC - f/u oral anticoagulation plan  [name], PharmD, BCPS Pager # [phone] 09/16/2012 11:25 AM

## 2012-09-17 DIAGNOSIS — I824Y9 Acute embolism and thrombosis of unspecified deep veins of unspecified proximal lower extremity: Secondary | ICD-10-CM

## 2012-09-17 DIAGNOSIS — I82429 Acute embolism and thrombosis of unspecified iliac vein: Secondary | ICD-10-CM | POA: Diagnosis present

## 2012-09-17 DIAGNOSIS — I871 Compression of vein: Principal | ICD-10-CM

## 2012-09-17 LAB — PROTIME-INR: Prothrombin Time: 13.5 seconds (ref 11.6–15.2)

## 2012-09-17 LAB — CBC
HCT: 30.5 % — ABNORMAL LOW (ref 36.0–46.0)
MCH: 24 pg — ABNORMAL LOW (ref 26.0–34.0)
MCV: 76.3 fL — ABNORMAL LOW (ref 78.0–100.0)
Platelets: 216 10*3/uL (ref 150–400)
RDW: 20.2 % — ABNORMAL HIGH (ref 11.5–15.5)

## 2012-09-17 MED ORDER — RIVAROXABAN 15 MG PO TABS
15.0000 mg | ORAL_TABLET | Freq: Two times a day (BID) | ORAL | Status: DC
  Administered 2012-09-17: 15 mg via ORAL
  Filled 2012-09-17 (×2): qty 1

## 2012-09-17 MED ORDER — HEPARIN (PORCINE) IN NACL 100-0.45 UNIT/ML-% IJ SOLN
1000.0000 [IU]/h | INTRAMUSCULAR | Status: DC
  Filled 2012-09-17: qty 250

## 2012-09-17 MED ORDER — RIVAROXABAN 15 MG PO TABS: 15.0000 mg | ORAL_TABLET | Freq: Two times a day (BID) | ORAL | Status: DC

## 2012-09-17 MED ORDER — RIVAROXABAN 20 MG PO TABS: 20.0000 mg | ORAL_TABLET | Freq: Every day | ORAL | Status: DC

## 2012-09-17 NOTE — Progress Notes (Signed)
DC orders received.  Patient stable with no S/S of distress.  Medication and discharge information reviewed with patient and patient's husband.  TED hose applied to left leg and instructed to take off for 30 minutes during the day.  Xarelto card given to patient.  Patient DC home with husband. Nolon Nations

## 2012-09-17 NOTE — Care Management Note (Signed)
    Page 1 of 2   09/17/2012     11:25:52 AM   CARE MANAGEMENT NOTE 09/17/2012  Patient:  POLA, FURNO   Account Number:  [id number]  Date Initiated:  09/17/2012  Documentation initiated by:  GRAVES-BIGELOW,Laronda Lisby  Subjective/Objective Assessment:   Pt admitted with +DVT. Plan for dc on xarelto. CM did supply pt with 10 day free assist card and pt assist forms for pt to fax in.     Action/Plan:   CM did call [facility] and medication is available. CM provided pt with PCP information to Jovita Kussmaul and [facility]. CM called [facility] to asist with hospital bill and possible medicaid.   Anticipated DC Date:  09/17/2012   Anticipated DC Plan:  HOME W HOME HEALTH SERVICES  In-house referral  Financial Counselor      DC Planning Services  CM consult      [facility] Choice  HOME HEALTH   Choice offered to / List presented to:  C-5 Sibling        HH arranged  HH-1 RN  HH-10 DISEASE MANAGEMENT  HH-6 SOCIAL WORKER      HH agency  [facility]   Status of service:  Completed, signed off Medicare Important Message given?   (If response is "NO", the following Medicare IM given date fields will be blank) Date Medicare IM given:   Date Additional Medicare IM given:    Discharge Disposition:    Per UR Regulation:  Reviewed for med. necessity/level of care/duration of stay  If discussed at Long Length of Stay Meetings, dates discussed:    Comments:

## 2012-09-17 NOTE — Discharge Summary (Signed)
Physician Discharge Summary  Kirsten Edwards ZOX:[id number] DOB: [birth year] DOA: 09/14/2012  PCP: No primary provider on file.  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 She will start IV heparin and transferred to [facility] where she underwent thrombolysis and thrombectomy. She then underwent PTA and stenting of the underlying chronic iliac vein obstruction. She was observed in intensive care unit post thrombolysis on IV heparin and she remained hemodynamic stable. She was placed on Xarelto at discharge and compression stockings. She will followup with Dr. Malachy Moan from interventional radiology to decide when to stop anticoagulation and switch the aspirin alone.  Procedures:  See above  Consultations:  Malachy Moan from intervention radiology  Discharge Exam: Filed Vitals:   09/16/12 1430 09/16/12 2100 09/17/12 0500 09/17/12 0800  BP: 122/83 107/78 119/97 127/81  Pulse: 67 69 83 73  Temp: 98.9 F (37.2 C) 99.1 F (37.3 C) 98.9 F (37.2 C) 99 F (37.2 C)  TempSrc: Oral Oral Oral Oral  Resp:    20  Height: 5\' 3"  (1.6 m)     Weight: 67.858 kg (149 lb 9.6 oz)   69.809 kg (153 lb 14.4 oz)  SpO2: 100% 100% 100% 100%    General: Alert and oriented x3 Cardiovascular: Regular rate and rhythm Respiratory: Clear to auscultation  Discharge  Instructions  Discharge Orders    Future Orders Please Complete By Expires   Diet general      Increase activity slowly          Medication List     As of 09/17/2012 10:34 AM    TAKE these medications         rifampin 300 MG capsule   Commonly known as: RIFADIN   Take 300 mg by mouth daily.      Rivaroxaban 15 MG Tabs tablet   Commonly known as: XARELTO   Take 1 tablet (15 mg total) by mouth 2 (two) times daily.      Rivaroxaban 20 MG Tabs   Commonly known as: XARELTO   Take 1 tablet (20 mg total) by mouth daily.           Follow-up Information    Schedule an appointment as soon as possible for a visit with Malachy Moan .   Contact information:   [facility] [address] [place], [place] [address] [phone]           The results of significant diagnostics from this hospitalization (including imaging, microbiology, ancillary and laboratory) are listed below for reference.    Significant Diagnostic Studies: Dg Chest 2 View  09/14/2012  *RADIOLOGY REPORT*  Clinical Data: Leg swelling, ankle swelling  CHEST - 2 VIEW  Comparison: None.  Findings: Normal mediastinum and cardiac silhouette.  Normal pulmonary  vasculature.  No evidence of effusion, infiltrate, or pneumothorax.  No acute bony abnormality.  IMPRESSION: No acute cardiopulmonary process.  Original Report Authenticated By: Genevive Bi, M.D.    Ct Angio Chest Pe W/cm &/or Wo Cm  09/14/2012  *RADIOLOGY REPORT*  Clinical Data: Short of breath, concern for pulmonary embolism, hypotensive  CT ANGIOGRAPHY CHEST  Technique:  Multidetector CT imaging of the chest using the standard protocol during bolus administration of intravenous contrast. Multiplanar reconstructed images including MIPs were obtained and reviewed to evaluate the vascular anatomy.  Contrast:  100 ml Omnipaque 300  Comparison: Chest radiograph 09/14/2012  Findings: There are no filling defects within the pulmonary arteries to suggest  acute pulmonary embolism.  No acute findings of the aorta or great vessels.  No pericardial fluid.  Esophagus is normal.  No axillary or supraclavicular lymphadenopathy.  No mediastinal lymphadenopathy.  Review of the lung parenchyma demonstrates no pneumothorax, pleural fluid, or pulmonary edema.  Limited view of the upper abdomen is unremarkable.  Limited view of the skeleton is unremarkable.  IMPRESSION:  1.  No evidence of pulmonary embolism.  2.  No evidence of dissection.  3.  No pneumothorax.   Original Report Authenticated By: Genevive Bi, M.D.    Ir Veno/ext/uni Left  09/15/2012  *RADIOLOGY REPORT*  IR VENOUS THROMBOLYSIS/THROMBECTOMY MECHANICAL MODERATE SEDATION; IR VENOUS PTA LEFT; IR VENOUS STENTING LEFT  Date: 09/15/2012  Clinical History: 46 year old female with acute onset of rapidly progressive left lower extremity swelling and mass of left lower extremity DVT.  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  She presents for catheter directed pharmacomechanical thrombolysis with angioplasty and stenting of and underlying mechanical obstruction as needed.  Procedures Performed: 1. Ultrasound-guided puncture of the left popliteal vein 2.  Left lower extremity venogram 3.  Catheterization of the left common femoral vein and left iliac and femoral venography 4.  Catheterization of the inferior vena cava 5.  Inferior venacavagram 6.  Pharmacomechanical thrombolysis/thrombectomy with AngioJet device 7.  Balloon angioplasty of left common iliac vein 8.  Placement of a self-expanding stent across the left iliac vein stenosis 9.  Completion left femoral and iliac venography  Interventional Radiologist:  Sterling Big, MD  Sedation: Moderate (conscious) sedation was used.  4 mg Versed, 150 mcg Fentanyl were administered intravenously.  The patient's vital signs were monitored continuously by radiology nursing throughout the procedure.  Sedation  Time: 135 minutes  Fluoroscopy time: 40 minutes  Contrast volume: 140 ml Visipaque 320 administered intravenously  PROCEDURE/FINDINGS:   Informed consent was obtained from the patient with the patient's husband serving as an interpreter following explanation of the procedure, risks, benefits and alternatives.  The patient understands, agrees and consents for the procedure.  All questions were addressed. A time out was performed.  Maximal barrier sterile technique utilized including caps, mask, sterile gowns, sterile gloves, large sterile drape, hand hygiene, and betadine skin prep.  The left popliteal fossa was interrogated with ultrasound.  The popliteal vein was identified and found to be patent.  Local anesthesia was achieved with infiltration of 1% lidocaine.  Under direct sonographic guidance, the vessel was punctured with a 21- gauge micropuncture needle.  A transitional 5-French sheath was inserted into the vein over a 0.018 inches micro wire.  The transitional sheath was then exchanged for a 6-French vascular sheath over a Benson wire.  An initial left lower extremity popliteal and femoral venogram was performed through the sheath.  The popliteal vein is patent.  There is an incompletely obstructing 4 cm thrombus in the distal one third of the femoral vein.  The  middle third of the femoral vein is patent. Flow is very slow suggesting proximal obstruction. Therefore, a Kumpe catheter and Bentson wire were used to catheterize the proximal femoral vein.  A femoral and common femoral venogram was then performed.  There is extensive filling defect within the proximal femoral vein and common femoral vein which is incompletely obstructed.  Thrombus can be seen extending into the saphenofemoral junction.  There is complete obstruction at the level of the pelvic brim with filling of multiple pelvic venous collaterals.  The catheter was successfully navigated beyond the apparent obstruction into the external iliac  vein.  A repeat venogram was performed demonstrating extensive thrombus extending through the external iliac vein.  The common iliac vein is nearly completely occluded. Multiple small thread-like channels passed through the area and eventually flow can be seen in the inferior vena cava. This appearance is consistent with iliac vein compression syndrome and recanalized chronic thrombus in the narrowed common iliac vein.  After multiple attempts, the nearly occluded common iliac vein was successfully crossed with a stiff Glidewire and 50F glide catheter.  The glide wire was exchanged for a superstiff Amplatz wire and a 5-French pigtail catheter was advanced into the distal inferior vena cava.  An inferior venacavagram was performed.  There is no clot extension into the cava.  The inflow from the right common iliac vein is robust.  The catheter was then removed over the wire.  A total of 10 mg of TPA mixed in 100 ml of normal saline was then infused into the thrombus using the power pulse spray technique of the AngioJet device.  The thrombolytic was then allowed to activate for approximately 15 minutes.  The AngioJet device was then switched to thrombectomy mode and pharmacomechanical thrombectomy was performed throughout all areas of thrombus.  Repeat venography demonstrated significant decrease in clot burden with resolution of clot in the distal femoral vein but some persistent clot in the proximal femoral vein and external iliac vein.  Therefore, balloon maceration of the thrombus was performed with a 10 x 40 mm Mustang balloon followed by additional AngioJet mechanical thrombectomy. Follow up venogram demonstrated minimal residual thrombus, therefore the iliac vein occlusion was serially dilated with first a 10 x 40 mm Mustang balloon followed by a 12 x 40 mm Conquest balloon.  Ultimately, a 14 x 80 mm self- expandable nitinol Smart stent was deployed across the stenosis.  The stent was then postdilated to 14 mm  with a 14 x 40 mm Atlas balloon.  A final venogram from the popliteal sheath demonstrated excellent in-line flow with no residual iliac vein stenosis.  There is no residual thrombus.  Following normalization of the activated clotting time, the popliteal sheath was removed and hemostasis obtained with manual pressure.  There is no immediate cava to patient, the patient tolerated the procedure well.  IMPRESSION:  1.  Diagnostic venography demonstrates findings consistent with high-grade stenosis versus complete occlusion of the common iliac vein.  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  Additionally, there is obstructing acute thrombus  in the left common and external iliac veins, and extensive nonobstructing acute thrombus  in the common femoral, and femoral veins.  2.  Successful single session pharmacomechanical thrombectomy/thrombolysis with 10 mg TPA combined with AngioJet rheolytic mechanical thrombectomy and balloon maceration.  3.  Angioplasty and stenting of the chronic left common iliac vein stenosis with placement of a 14 x 80 mm self-expanding nitinol stent and restoration of in-line venous  drainage from the left lower extremity to the inferior vena cava.  4.  Post procedure care:  - Resume full heparinization in 2 - 3 hours - Bedrest for 4 hours, the patient may advance ambulation as tolerated - The patient will require systemic anticoagulation for 3 months. After anticoagulation is complete, she should take an aspirin daily for the remainder of her life. - 30 mmHg thigh-high compression hose to be worn on the left lower extremity daily for the next 2 years to minimize the risk of post thrombotic syndrome - Follow up in IR Clinic in 2-4 weeks  Signed,  Sterling Big, MD Vascular & Interventional Radiologist [facility]   Original Report Authenticated By: Vilma Prader    Ir Intravas Stent Non Coron,carot,vert,iliac,low Ext Art  09/15/2012  *RADIOLOGY  REPORT*  IR VENOUS THROMBOLYSIS/THROMBECTOMY MECHANICAL MODERATE SEDATION; IR VENOUS PTA LEFT; IR VENOUS STENTING LEFT  Date: 09/15/2012  Clinical History: 46 year old female with acute onset of rapidly progressive left lower extremity swelling and mass of left lower extremity DVT.  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  She presents for catheter directed pharmacomechanical thrombolysis with angioplasty and stenting of and underlying mechanical obstruction as needed.  Procedures Performed: 1. Ultrasound-guided puncture of the left popliteal vein 2.  Left lower extremity venogram 3.  Catheterization of the left common femoral vein and left iliac and femoral venography 4.  Catheterization of the inferior vena cava 5.  Inferior venacavagram 6.  Pharmacomechanical thrombolysis/thrombectomy with AngioJet device 7.  Balloon angioplasty of left common iliac vein 8.  Placement of a self-expanding stent across the left iliac vein stenosis 9.  Completion left femoral and iliac venography  Interventional Radiologist:  Sterling Big, MD  Sedation: Moderate (conscious) sedation was used.  4 mg Versed, 150 mcg Fentanyl were administered intravenously.  The patient's vital signs were monitored continuously by radiology nursing throughout the procedure.  Sedation Time: 135 minutes  Fluoroscopy time: 40 minutes  Contrast volume: 140 ml Visipaque 320 administered intravenously  PROCEDURE/FINDINGS:   Informed consent was obtained from the patient with the patient's husband serving as an interpreter following explanation of the procedure, risks, benefits and alternatives.  The patient understands, agrees and consents for the procedure.  All questions were addressed. A time out was performed.  Maximal barrier sterile technique utilized including caps, mask, sterile gowns, sterile gloves, large sterile drape, hand hygiene, and betadine skin prep.  The left popliteal fossa  was interrogated with ultrasound.  The popliteal vein was identified and found to be patent.  Local anesthesia was achieved with infiltration of 1% lidocaine.  Under direct sonographic guidance, the vessel was punctured with a 21- gauge micropuncture needle.  A transitional 5-French sheath was inserted into the vein over a 0.018 inches micro wire.  The transitional sheath was then exchanged for a 6-French vascular sheath over a Benson wire.  An initial left lower extremity popliteal and femoral venogram was performed through the sheath.  The popliteal vein is patent.  There is an incompletely obstructing 4 cm thrombus in the distal one third of the femoral vein.  The middle third of the femoral vein is patent. Flow is very slow suggesting proximal obstruction. Therefore, a Kumpe catheter and Bentson wire were used to catheterize the proximal femoral vein.  A femoral and common femoral venogram was then performed.  There is extensive filling defect within the proximal femoral vein and common femoral vein which is incompletely obstructed.  Thrombus can be  seen extending into the saphenofemoral junction.  There is complete obstruction at the level of the pelvic brim with filling of multiple pelvic venous collaterals.  The catheter was successfully navigated beyond the apparent obstruction into the external iliac vein.  A repeat venogram was performed demonstrating extensive thrombus extending through the external iliac vein.  The common iliac vein is nearly completely occluded. Multiple small thread-like channels passed through the area and eventually flow can be seen in the inferior vena cava. This appearance is consistent with iliac vein compression syndrome and recanalized chronic thrombus in the narrowed common iliac vein.  After multiple attempts, the nearly occluded common iliac vein was successfully crossed with a stiff Glidewire and 27F glide catheter.  The glide wire was exchanged for a superstiff Amplatz wire  and a 5-French pigtail catheter was advanced into the distal inferior vena cava.  An inferior venacavagram was performed.  There is no clot extension into the cava.  The inflow from the right common iliac vein is robust.  The catheter was then removed over the wire.  A total of 10 mg of TPA mixed in 100 ml of normal saline was then infused into the thrombus using the power pulse spray technique of the AngioJet device.  The thrombolytic was then allowed to activate for approximately 15 minutes.  The AngioJet device was then switched to thrombectomy mode and pharmacomechanical thrombectomy was performed throughout all areas of thrombus.  Repeat venography demonstrated significant decrease in clot burden with resolution of clot in the distal femoral vein but some persistent clot in the proximal femoral vein and external iliac vein.  Therefore, balloon maceration of the thrombus was performed with a 10 x 40 mm Mustang balloon followed by additional AngioJet mechanical thrombectomy. Follow up venogram demonstrated minimal residual thrombus, therefore the iliac vein occlusion was serially dilated with first a 10 x 40 mm Mustang balloon followed by a 12 x 40 mm Conquest balloon.  Ultimately, a 14 x 80 mm self- expandable nitinol Smart stent was deployed across the stenosis.  The stent was then postdilated to 14 mm with a 14 x 40 mm Atlas balloon.  A final venogram from the popliteal sheath demonstrated excellent in-line flow with no residual iliac vein stenosis.  There is no residual thrombus.  Following normalization of the activated clotting time, the popliteal sheath was removed and hemostasis obtained with manual pressure.  There is no immediate cava to patient, the patient tolerated the procedure well.  IMPRESSION:  1.  Diagnostic venography demonstrates findings consistent with high-grade stenosis versus complete occlusion of the common iliac vein.  The appearance is consistent with May-Thurner (iliac vein  compression) syndrome.  Additionally, there is obstructing acute thrombus  in the left common and external iliac veins, and extensive nonobstructing acute thrombus  in the common femoral, and femoral veins.  2.  Successful single session pharmacomechanical thrombectomy/thrombolysis with 10 mg TPA combined with AngioJet rheolytic mechanical thrombectomy and balloon maceration.  3.  Angioplasty and stenting of the chronic left common iliac vein stenosis with placement of a 14 x 80 mm self-expanding nitinol stent and restoration of in-line venous drainage from the left lower extremity to the inferior vena cava.  4.  Post procedure care:  - Resume full heparinization in 2 - 3 hours - Bedrest for 4 hours, the patient may advance ambulation as tolerated - The patient will require systemic anticoagulation for 3 months. After anticoagulation is complete, she should take an aspirin daily for the remainder of  her life. - 30 mmHg thigh-high compression hose to be worn on the left lower extremity daily for the next 2 years to minimize the risk of post thrombotic syndrome - Follow up in IR Clinic in 2-4 weeks  Signed,  Sterling Big, MD Vascular & Interventional Radiologist [facility]   Original Report Authenticated By: Marlynn Perking Venous Left  09/15/2012  *RADIOLOGY REPORT*  IR VENOUS THROMBOLYSIS/THROMBECTOMY MECHANICAL MODERATE SEDATION; IR VENOUS PTA LEFT; IR VENOUS STENTING LEFT  Date: 09/15/2012  Clinical History: 46 year old female with acute onset of rapidly progressive left lower extremity swelling and mass of left lower extremity DVT.  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  She presents for catheter directed pharmacomechanical thrombolysis with angioplasty and stenting of and underlying mechanical obstruction as needed.  Procedures Performed: 1. Ultrasound-guided puncture of the left popliteal vein 2.  Left lower extremity venogram 3.   Catheterization of the left common femoral vein and left iliac and femoral venography 4.  Catheterization of the inferior vena cava 5.  Inferior venacavagram 6.  Pharmacomechanical thrombolysis/thrombectomy with AngioJet device 7.  Balloon angioplasty of left common iliac vein 8.  Placement of a self-expanding stent across the left iliac vein stenosis 9.  Completion left femoral and iliac venography  Interventional Radiologist:  Sterling Big, MD  Sedation: Moderate (conscious) sedation was used.  4 mg Versed, 150 mcg Fentanyl were administered intravenously.  The patient's vital signs were monitored continuously by radiology nursing throughout the procedure.  Sedation Time: 135 minutes  Fluoroscopy time: 40 minutes  Contrast volume: 140 ml Visipaque 320 administered intravenously  PROCEDURE/FINDINGS:   Informed consent was obtained from the patient with the patient's husband serving as an interpreter following explanation of the procedure, risks, benefits and alternatives.  The patient understands, agrees and consents for the procedure.  All questions were addressed. A time out was performed.  Maximal barrier sterile technique utilized including caps, mask, sterile gowns, sterile gloves, large sterile drape, hand hygiene, and betadine skin prep.  The left popliteal fossa was interrogated with ultrasound.  The popliteal vein was identified and found to be patent.  Local anesthesia was achieved with infiltration of 1% lidocaine.  Under direct sonographic guidance, the vessel was punctured with a 21- gauge micropuncture needle.  A transitional 5-French sheath was inserted into the vein over a 0.018 inches micro wire.  The transitional sheath was then exchanged for a 6-French vascular sheath over a Benson wire.  An initial left lower extremity popliteal and femoral venogram was performed through the sheath.  The popliteal vein is patent.  There is an incompletely obstructing 4 cm thrombus in the distal one third  of the femoral vein.  The middle third of the femoral vein is patent. Flow is very slow suggesting proximal obstruction. Therefore, a Kumpe catheter and Bentson wire were used to catheterize the proximal femoral vein.  A femoral and common femoral venogram was then performed.  There is extensive filling defect within the proximal femoral vein and common femoral vein which is incompletely obstructed.  Thrombus can be seen extending into the saphenofemoral junction.  There is complete obstruction at the level of the pelvic brim with filling of multiple pelvic venous collaterals.  The catheter was successfully navigated beyond the apparent obstruction into the external iliac vein.  A repeat venogram was performed demonstrating extensive thrombus extending through the external iliac vein.  The common iliac vein is nearly completely occluded. Multiple small  thread-like channels passed through the area and eventually flow can be seen in the inferior vena cava. This appearance is consistent with iliac vein compression syndrome and recanalized chronic thrombus in the narrowed common iliac vein.  After multiple attempts, the nearly occluded common iliac vein was successfully crossed with a stiff Glidewire and 44F glide catheter.  The glide wire was exchanged for a superstiff Amplatz wire and a 5-French pigtail catheter was advanced into the distal inferior vena cava.  An inferior venacavagram was performed.  There is no clot extension into the cava.  The inflow from the right common iliac vein is robust.  The catheter was then removed over the wire.  A total of 10 mg of TPA mixed in 100 ml of normal saline was then infused into the thrombus using the power pulse spray technique of the AngioJet device.  The thrombolytic was then allowed to activate for approximately 15 minutes.  The AngioJet device was then switched to thrombectomy mode and pharmacomechanical thrombectomy was performed throughout all areas of thrombus.  Repeat  venography demonstrated significant decrease in clot burden with resolution of clot in the distal femoral vein but some persistent clot in the proximal femoral vein and external iliac vein.  Therefore, balloon maceration of the thrombus was performed with a 10 x 40 mm Mustang balloon followed by additional AngioJet mechanical thrombectomy. Follow up venogram demonstrated minimal residual thrombus, therefore the iliac vein occlusion was serially dilated with first a 10 x 40 mm Mustang balloon followed by a 12 x 40 mm Conquest balloon.  Ultimately, a 14 x 80 mm self- expandable nitinol Smart stent was deployed across the stenosis.  The stent was then postdilated to 14 mm with a 14 x 40 mm Atlas balloon.  A final venogram from the popliteal sheath demonstrated excellent in-line flow with no residual iliac vein stenosis.  There is no residual thrombus.  Following normalization of the activated clotting time, the popliteal sheath was removed and hemostasis obtained with manual pressure.  There is no immediate cava to patient, the patient tolerated the procedure well.  IMPRESSION:  1.  Diagnostic venography demonstrates findings consistent with high-grade stenosis versus complete occlusion of the common iliac vein.  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  Additionally, there is obstructing acute thrombus  in the left common and external iliac veins, and extensive nonobstructing acute thrombus  in the common femoral, and femoral veins.  2.  Successful single session pharmacomechanical thrombectomy/thrombolysis with 10 mg TPA combined with AngioJet rheolytic mechanical thrombectomy and balloon maceration.  3.  Angioplasty and stenting of the chronic left common iliac vein stenosis with placement of a 14 x 80 mm self-expanding nitinol stent and restoration of in-line venous drainage from the left lower extremity to the inferior vena cava.  4.  Post procedure care:  - Resume full  heparinization in 2 - 3 hours - Bedrest for 4 hours, the patient may advance ambulation as tolerated - The patient will require systemic anticoagulation for 3 months. After anticoagulation is complete, she should take an aspirin daily for the remainder of her life. - 30 mmHg thigh-high compression hose to be worn on the left lower extremity daily for the next 2 years to minimize the risk of post thrombotic syndrome - Follow up in IR Clinic in 2-4 weeks  Signed,  Sterling Big, MD Vascular & Interventional Radiologist [facility] Radiology   Original Report Authenticated By: Vilma Prader    Ir Thrombect Veno Mech  Mod Sed  09/15/2012  *RADIOLOGY REPORT*  IR VENOUS THROMBOLYSIS/THROMBECTOMY MECHANICAL MODERATE SEDATION; IR VENOUS PTA LEFT; IR VENOUS STENTING LEFT  Date: 09/15/2012  Clinical History: 46 year old female with acute onset of rapidly progressive left lower extremity swelling and mass of left lower extremity DVT.  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  She presents for catheter directed pharmacomechanical thrombolysis with angioplasty and stenting of and underlying mechanical obstruction as needed.  Procedures Performed: 1. Ultrasound-guided puncture of the left popliteal vein 2.  Left lower extremity venogram 3.  Catheterization of the left common femoral vein and left iliac and femoral venography 4.  Catheterization of the inferior vena cava 5.  Inferior venacavagram 6.  Pharmacomechanical thrombolysis/thrombectomy with AngioJet device 7.  Balloon angioplasty of left common iliac vein 8.  Placement of a self-expanding stent across the left iliac vein stenosis 9.  Completion left femoral and iliac venography  Interventional Radiologist:  Sterling Big, MD  Sedation: Moderate (conscious) sedation was used.  4 mg Versed, 150 mcg Fentanyl were administered intravenously.  The patient's vital signs were monitored continuously by radiology  nursing throughout the procedure.  Sedation Time: 135 minutes  Fluoroscopy time: 40 minutes  Contrast volume: 140 ml Visipaque 320 administered intravenously  PROCEDURE/FINDINGS:   Informed consent was obtained from the patient with the patient's husband serving as an interpreter following explanation of the procedure, risks, benefits and alternatives.  The patient understands, agrees and consents for the procedure.  All questions were addressed. A time out was performed.  Maximal barrier sterile technique utilized including caps, mask, sterile gowns, sterile gloves, large sterile drape, hand hygiene, and betadine skin prep.  The left popliteal fossa was interrogated with ultrasound.  The popliteal vein was identified and found to be patent.  Local anesthesia was achieved with infiltration of 1% lidocaine.  Under direct sonographic guidance, the vessel was punctured with a 21- gauge micropuncture needle.  A transitional 5-French sheath was inserted into the vein over a 0.018 inches micro wire.  The transitional sheath was then exchanged for a 6-French vascular sheath over a Benson wire.  An initial left lower extremity popliteal and femoral venogram was performed through the sheath.  The popliteal vein is patent.  There is an incompletely obstructing 4 cm thrombus in the distal one third of the femoral vein.  The middle third of the femoral vein is patent. Flow is very slow suggesting proximal obstruction. Therefore, a Kumpe catheter and Bentson wire were used to catheterize the proximal femoral vein.  A femoral and common femoral venogram was then performed.  There is extensive filling defect within the proximal femoral vein and common femoral vein which is incompletely obstructed.  Thrombus can be seen extending into the saphenofemoral junction.  There is complete obstruction at the level of the pelvic brim with filling of multiple pelvic venous collaterals.  The catheter was successfully navigated beyond the  apparent obstruction into the external iliac vein.  A repeat venogram was performed demonstrating extensive thrombus extending through the external iliac vein.  The common iliac vein is nearly completely occluded. Multiple small thread-like channels passed through the area and eventually flow can be seen in the inferior vena cava. This appearance is consistent with iliac vein compression syndrome and recanalized chronic thrombus in the narrowed common iliac vein.  After multiple attempts, the nearly occluded common iliac vein was successfully crossed with a stiff Glidewire and 46F glide catheter.  The glide wire was exchanged for  a superstiff Amplatz wire and a 5-French pigtail catheter was advanced into the distal inferior vena cava.  An inferior venacavagram was performed.  There is no clot extension into the cava.  The inflow from the right common iliac vein is robust.  The catheter was then removed over the wire.  A total of 10 mg of TPA mixed in 100 ml of normal saline was then infused into the thrombus using the power pulse spray technique of the AngioJet device.  The thrombolytic was then allowed to activate for approximately 15 minutes.  The AngioJet device was then switched to thrombectomy mode and pharmacomechanical thrombectomy was performed throughout all areas of thrombus.  Repeat venography demonstrated significant decrease in clot burden with resolution of clot in the distal femoral vein but some persistent clot in the proximal femoral vein and external iliac vein.  Therefore, balloon maceration of the thrombus was performed with a 10 x 40 mm Mustang balloon followed by additional AngioJet mechanical thrombectomy. Follow up venogram demonstrated minimal residual thrombus, therefore the iliac vein occlusion was serially dilated with first a 10 x 40 mm Mustang balloon followed by a 12 x 40 mm Conquest balloon.  Ultimately, a 14 x 80 mm self- expandable nitinol Smart stent was deployed across the  stenosis.  The stent was then postdilated to 14 mm with a 14 x 40 mm Atlas balloon.  A final venogram from the popliteal sheath demonstrated excellent in-line flow with no residual iliac vein stenosis.  There is no residual thrombus.  Following normalization of the activated clotting time, the popliteal sheath was removed and hemostasis obtained with manual pressure.  There is no immediate cava to patient, the patient tolerated the procedure well.  IMPRESSION:  1.  Diagnostic venography demonstrates findings consistent with high-grade stenosis versus complete occlusion of the common iliac vein.  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  Additionally, there is obstructing acute thrombus  in the left common and external iliac veins, and extensive nonobstructing acute thrombus  in the common femoral, and femoral veins.  2.  Successful single session pharmacomechanical thrombectomy/thrombolysis with 10 mg TPA combined with AngioJet rheolytic mechanical thrombectomy and balloon maceration.  3.  Angioplasty and stenting of the chronic left common iliac vein stenosis with placement of a 14 x 80 mm self-expanding nitinol stent and restoration of in-line venous drainage from the left lower extremity to the inferior vena cava.  4.  Post procedure care:  - Resume full heparinization in 2 - 3 hours - Bedrest for 4 hours, the patient may advance ambulation as tolerated - The patient will require systemic anticoagulation for 3 months. After anticoagulation is complete, she should take an aspirin daily for the remainder of her life. - 30 mmHg thigh-high compression hose to be worn on the left lower extremity daily for the next 2 years to minimize the risk of post thrombotic syndrome - Follow up in IR Clinic in 2-4 weeks  Signed,  Sterling Big, MD Vascular & Interventional Radiologist [facility] Radiology   Original Report Authenticated By: Vilma Prader    Ir US Guide Vasc Access  Left  09/15/2012  *RADIOLOGY REPORT*  IR VENOUS THROMBOLYSIS/THROMBECTOMY MECHANICAL MODERATE SEDATION; IR VENOUS PTA LEFT; IR VENOUS STENTING LEFT  Date: 09/15/2012  Clinical History: 46 year old female with acute onset of rapidly progressive left lower extremity swelling and mass of left lower extremity DVT.  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  She presents for catheter  directed pharmacomechanical thrombolysis with angioplasty and stenting of and underlying mechanical obstruction as needed.  Procedures Performed: 1. Ultrasound-guided puncture of the left popliteal vein 2.  Left lower extremity venogram 3.  Catheterization of the left common femoral vein and left iliac and femoral venography 4.  Catheterization of the inferior vena cava 5.  Inferior venacavagram 6.  Pharmacomechanical thrombolysis/thrombectomy with AngioJet device 7.  Balloon angioplasty of left common iliac vein 8.  Placement of a self-expanding stent across the left iliac vein stenosis 9.  Completion left femoral and iliac venography  Interventional Radiologist:  Sterling Big, MD  Sedation: Moderate (conscious) sedation was used.  4 mg Versed, 150 mcg Fentanyl were administered intravenously.  The patient's vital signs were monitored continuously by radiology nursing throughout the procedure.  Sedation Time: 135 minutes  Fluoroscopy time: 40 minutes  Contrast volume: 140 ml Visipaque 320 administered intravenously  PROCEDURE/FINDINGS:   Informed consent was obtained from the patient with the patient's husband serving as an interpreter following explanation of the procedure, risks, benefits and alternatives.  The patient understands, agrees and consents for the procedure.  All questions were addressed. A time out was performed.  Maximal barrier sterile technique utilized including caps, mask, sterile gowns, sterile gloves, large sterile drape, hand hygiene, and betadine skin  prep.  The left popliteal fossa was interrogated with ultrasound.  The popliteal vein was identified and found to be patent.  Local anesthesia was achieved with infiltration of 1% lidocaine.  Under direct sonographic guidance, the vessel was punctured with a 21- gauge micropuncture needle.  A transitional 5-French sheath was inserted into the vein over a 0.018 inches micro wire.  The transitional sheath was then exchanged for a 6-French vascular sheath over a Benson wire.  An initial left lower extremity popliteal and femoral venogram was performed through the sheath.  The popliteal vein is patent.  There is an incompletely obstructing 4 cm thrombus in the distal one third of the femoral vein.  The middle third of the femoral vein is patent. Flow is very slow suggesting proximal obstruction. Therefore, a Kumpe catheter and Bentson wire were used to catheterize the proximal femoral vein.  A femoral and common femoral venogram was then performed.  There is extensive filling defect within the proximal femoral vein and common femoral vein which is incompletely obstructed.  Thrombus can be seen extending into the saphenofemoral junction.  There is complete obstruction at the level of the pelvic brim with filling of multiple pelvic venous collaterals.  The catheter was successfully navigated beyond the apparent obstruction into the external iliac vein.  A repeat venogram was performed demonstrating extensive thrombus extending through the external iliac vein.  The common iliac vein is nearly completely occluded. Multiple small thread-like channels passed through the area and eventually flow can be seen in the inferior vena cava. This appearance is consistent with iliac vein compression syndrome and recanalized chronic thrombus in the narrowed common iliac vein.  After multiple attempts, the nearly occluded common iliac vein was successfully crossed with a stiff Glidewire and 16F glide catheter.  The glide wire was  exchanged for a superstiff Amplatz wire and a 5-French pigtail catheter was advanced into the distal inferior vena cava.  An inferior venacavagram was performed.  There is no clot extension into the cava.  The inflow from the right common iliac vein is robust.  The catheter was then removed over the wire.  A total of 10 mg of TPA mixed in 100 ml  of normal saline was then infused into the thrombus using the power pulse spray technique of the AngioJet device.  The thrombolytic was then allowed to activate for approximately 15 minutes.  The AngioJet device was then switched to thrombectomy mode and pharmacomechanical thrombectomy was performed throughout all areas of thrombus.  Repeat venography demonstrated significant decrease in clot burden with resolution of clot in the distal femoral vein but some persistent clot in the proximal femoral vein and external iliac vein.  Therefore, balloon maceration of the thrombus was performed with a 10 x 40 mm Mustang balloon followed by additional AngioJet mechanical thrombectomy. Follow up venogram demonstrated minimal residual thrombus, therefore the iliac vein occlusion was serially dilated with first a 10 x 40 mm Mustang balloon followed by a 12 x 40 mm Conquest balloon.  Ultimately, a 14 x 80 mm self- expandable nitinol Smart stent was deployed across the stenosis.  The stent was then postdilated to 14 mm with a 14 x 40 mm Atlas balloon.  A final venogram from the popliteal sheath demonstrated excellent in-line flow with no residual iliac vein stenosis.  There is no residual thrombus.  Following normalization of the activated clotting time, the popliteal sheath was removed and hemostasis obtained with manual pressure.  There is no immediate cava to patient, the patient tolerated the procedure well.  IMPRESSION:  1.  Diagnostic venography demonstrates findings consistent with high-grade stenosis versus complete occlusion of the common iliac vein.  The appearance is consistent  with May-Thurner (iliac vein compression) syndrome.  Additionally, there is obstructing acute thrombus  in the left common and external iliac veins, and extensive nonobstructing acute thrombus  in the common femoral, and femoral veins.  2.  Successful single session pharmacomechanical thrombectomy/thrombolysis with 10 mg TPA combined with AngioJet rheolytic mechanical thrombectomy and balloon maceration.  3.  Angioplasty and stenting of the chronic left common iliac vein stenosis with placement of a 14 x 80 mm self-expanding nitinol stent and restoration of in-line venous drainage from the left lower extremity to the inferior vena cava.  4.  Post procedure care:  - Resume full heparinization in 2 - 3 hours - Bedrest for 4 hours, the patient may advance ambulation as tolerated - The patient will require systemic anticoagulation for 3 months. After anticoagulation is complete, she should take an aspirin daily for the remainder of her life. - 30 mmHg thigh-high compression hose to be worn on the left lower extremity daily for the next 2 years to minimize the risk of post thrombotic syndrome - Follow up in IR Clinic in 2-4 weeks  Signed,  Sterling Big, MD Vascular & Interventional Radiologist [facility] Radiology   Original Report Authenticated By: Vilma Prader    Ir Radiologist Eval & Mgmt  09/15/2012  *RADIOLOGY REPORT*  IR VENOUS THROMBOLYSIS/THROMBECTOMY MECHANICAL MODERATE SEDATION; IR VENOUS PTA LEFT; IR VENOUS STENTING LEFT  Date: 09/15/2012  Clinical History: 46 year old female with acute onset of rapidly progressive left lower extremity swelling and mass of left lower extremity DVT.  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  She presents for catheter directed pharmacomechanical thrombolysis with angioplasty and stenting of and underlying mechanical obstruction as needed.  Procedures Performed: 1. Ultrasound-guided puncture of the left popliteal vein 2.   Left lower extremity venogram 3.  Catheterization of the left common femoral vein and left iliac and femoral venography 4.  Catheterization of the inferior vena cava 5.  Inferior venacavagram 6.  Pharmacomechanical thrombolysis/thrombectomy with AngioJet device  7.  Balloon angioplasty of left common iliac vein 8.  Placement of a self-expanding stent across the left iliac vein stenosis 9.  Completion left femoral and iliac venography  Interventional Radiologist:  Sterling Big, MD  Sedation: Moderate (conscious) sedation was used.  4 mg Versed, 150 mcg Fentanyl were administered intravenously.  The patient's vital signs were monitored continuously by radiology nursing throughout the procedure.  Sedation Time: 135 minutes  Fluoroscopy time: 40 minutes  Contrast volume: 140 ml Visipaque 320 administered intravenously  PROCEDURE/FINDINGS:   Informed consent was obtained from the patient with the patient's husband serving as an interpreter following explanation of the procedure, risks, benefits and alternatives.  The patient understands, agrees and consents for the procedure.  All questions were addressed. A time out was performed.  Maximal barrier sterile technique utilized including caps, mask, sterile gowns, sterile gloves, large sterile drape, hand hygiene, and betadine skin prep.  The left popliteal fossa was interrogated with ultrasound.  The popliteal vein was identified and found to be patent.  Local anesthesia was achieved with infiltration of 1% lidocaine.  Under direct sonographic guidance, the vessel was punctured with a 21- gauge micropuncture needle.  A transitional 5-French sheath was inserted into the vein over a 0.018 inches micro wire.  The transitional sheath was then exchanged for a 6-French vascular sheath over a Benson wire.  An initial left lower extremity popliteal and femoral venogram was performed through the sheath.  The popliteal vein is patent.  There is an incompletely obstructing 4  cm thrombus in the distal one third of the femoral vein.  The middle third of the femoral vein is patent. Flow is very slow suggesting proximal obstruction. Therefore, a Kumpe catheter and Bentson wire were used to catheterize the proximal femoral vein.  A femoral and common femoral venogram was then performed.  There is extensive filling defect within the proximal femoral vein and common femoral vein which is incompletely obstructed.  Thrombus can be seen extending into the saphenofemoral junction.  There is complete obstruction at the level of the pelvic brim with filling of multiple pelvic venous collaterals.  The catheter was successfully navigated beyond the apparent obstruction into the external iliac vein.  A repeat venogram was performed demonstrating extensive thrombus extending through the external iliac vein.  The common iliac vein is nearly completely occluded. Multiple small thread-like channels passed through the area and eventually flow can be seen in the inferior vena cava. This appearance is consistent with iliac vein compression syndrome and recanalized chronic thrombus in the narrowed common iliac vein.  After multiple attempts, the nearly occluded common iliac vein was successfully crossed with a stiff Glidewire and 1F glide catheter.  The glide wire was exchanged for a superstiff Amplatz wire and a 5-French pigtail catheter was advanced into the distal inferior vena cava.  An inferior venacavagram was performed.  There is no clot extension into the cava.  The inflow from the right common iliac vein is robust.  The catheter was then removed over the wire.  A total of 10 mg of TPA mixed in 100 ml of normal saline was then infused into the thrombus using the power pulse spray technique of the AngioJet device.  The thrombolytic was then allowed to activate for approximately 15 minutes.  The AngioJet device was then switched to thrombectomy mode and pharmacomechanical thrombectomy was performed  throughout all areas of thrombus.  Repeat venography demonstrated significant decrease in clot burden with resolution of clot in  the distal femoral vein but some persistent clot in the proximal femoral vein and external iliac vein.  Therefore, balloon maceration of the thrombus was performed with a 10 x 40 mm Mustang balloon followed by additional AngioJet mechanical thrombectomy. Follow up venogram demonstrated minimal residual thrombus, therefore the iliac vein occlusion was serially dilated with first a 10 x 40 mm Mustang balloon followed by a 12 x 40 mm Conquest balloon.  Ultimately, a 14 x 80 mm self- expandable nitinol Smart stent was deployed across the stenosis.  The stent was then postdilated to 14 mm with a 14 x 40 mm Atlas balloon.  A final venogram from the popliteal sheath demonstrated excellent in-line flow with no residual iliac vein stenosis.  There is no residual thrombus.  Following normalization of the activated clotting time, the popliteal sheath was removed and hemostasis obtained with manual pressure.  There is no immediate cava to patient, the patient tolerated the procedure well.  IMPRESSION:  1.  Diagnostic venography demonstrates findings consistent with high-grade stenosis versus complete occlusion of the common iliac vein.  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  Additionally, there is obstructing acute thrombus  in the left common and external iliac veins, and extensive nonobstructing acute thrombus  in the common femoral, and femoral veins.  2.  Successful single session pharmacomechanical thrombectomy/thrombolysis with 10 mg TPA combined with AngioJet rheolytic mechanical thrombectomy and balloon maceration.  3.  Angioplasty and stenting of the chronic left common iliac vein stenosis with placement of a 14 x 80 mm self-expanding nitinol stent and restoration of in-line venous drainage from the left lower extremity to the inferior vena cava.  4.   Post procedure care:  - Resume full heparinization in 2 - 3 hours - Bedrest for 4 hours, the patient may advance ambulation as tolerated - The patient will require systemic anticoagulation for 3 months. After anticoagulation is complete, she should take an aspirin daily for the remainder of her life. - 30 mmHg thigh-high compression hose to be worn on the left lower extremity daily for the next 2 years to minimize the risk of post thrombotic syndrome - Follow up in IR Clinic in 2-4 weeks  Signed,  Sterling Big, MD Vascular & Interventional Radiologist [facility] Radiology   Original Report Authenticated By: Vilma Prader     Microbiology: Recent Results (from the past 240 hour(s))  MRSA PCR SCREENING     Status: Normal   Collection Time   09/14/12 10:43 PM      Component Value Range Status Comment   MRSA by PCR NEGATIVE  NEGATIVE Final      Labs: Basic Metabolic Panel:  Lab 09/15/12 3244 09/14/12 1625  NA 137 134*  K 3.6 3.2*  CL 106 100  CO2 22 22  GLUCOSE 87 158*  BUN 5* 6  CREATININE 0.71 0.80  CALCIUM 7.9* 8.6  MG -- --  PHOS -- --   Liver Function Tests:  Lab 09/14/12 1625  AST 18  ALT 14  ALKPHOS 55  BILITOT 0.3  PROT 7.1  ALBUMIN 3.4*   No results found for this basename: LIPASE:5,AMYLASE:5 in the last 168 hours No results found for this basename: AMMONIA:5 in the last 168 hours CBC:  Lab 09/17/12 0730 09/16/12 0243 09/15/12 0254 09/14/12 1625  WBC 3.7* 5.5 7.7 4.6  NEUTROABS -- -- -- 2.8  HGB 9.6* 9.1* 9.3* 11.2*  HCT 30.5* 28.3* 29.4* 35.1*  MCV 76.3* 75.7* 75.2* 75.0*  PLT  216 194 216 284   Cardiac Enzymes:  Lab 09/16/12 0745 09/16/12 0243 09/15/12 2022  CKTOTAL -- -- --  CKMB -- -- --  CKMBINDEX -- -- --  TROPONINI <0.30 0.33* 0.63*   BNP: BNP (last 3 results) No results found for this basename: PROBNP:3 in the last 8760 hours CBG:  Lab 09/14/12 2244  GLUCAP 90    Time coordinating discharge:  Signed:  Tiago Humphrey  [facility] 09/17/2012, 10:34 AM

## 2012-09-17 NOTE — Progress Notes (Signed)
ANTICOAGULATION CONSULT NOTE - Follow Up Consult  Pharmacy Consult for Heparin  Indication: DVT and s/p thrombolysis/thrombectomy, May Thurner Syndrome  No Known Allergies  Patient Measurements: Height: 5\' 3"  (160 cm) Weight: 153 lb 14.4 oz (69.809 kg) IBW/kg (Calculated) : 52.4  Heparin Dosing Weight: 68 Kg  Vital Signs: Temp: 99 F (37.2 C) (10/01 0800) Temp src: Oral (10/01 0800) BP: 127/81 mmHg (10/01 0800) Pulse Rate: 73  (10/01 0800)  Labs:  Basename 09/17/12 0730 09/16/12 1559 09/16/12 0956 09/16/12 0745 09/16/12 0243 09/15/12 2022 09/15/12 0254 09/14/12 1625  HGB 9.6* -- -- -- 9.1* -- -- --  HCT 30.5* -- -- -- 28.3* -- 29.4* --  PLT 216 -- -- -- 194 -- 216 --  APTT -- -- -- -- -- -- -- 24  LABPROT 13.5 -- -- -- 30.7* -- -- 13.9  INR 1.04 -- -- -- 3.16* -- -- 1.08  HEPARINUNFRC 0.97* 0.76* 0.59 -- -- -- -- --  CREATININE -- -- -- -- -- -- 0.71 0.80  CKTOTAL -- -- -- -- -- -- -- --  CKMB -- -- -- -- -- -- -- --  TROPONINI -- -- -- <0.30 0.33* 0.63* -- --    Assessment: Kirsten Edwards was admitted with extensive LLE DVT, now s/p catheter directed thrombolysis/thrombectomy of femoral, common femoral and iliac vein 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 Heparin level is now above goal after dose decreased last night.   Goal of Therapy:  Heparin level 0.3-0.7 units/ml Monitor platelets by anticoagulation protocol: Yes   Plan:  - Decrease heparin to 1000 units/hr - Recheck heparin level in 6h  - Daily heparin level and CBC   Wendie Simmer, PharmD, BCPS Clinical Pharmacist  Pager: [phone]

## 2012-09-17 NOTE — Progress Notes (Signed)
Subjective: Pt ok. Out of ICU Left leg feels good, swelling going down. Wearing compression wrap  Objective: Physical Exam: BP 127/81  Pulse 73  Temp 99 F (37.2 C) (Oral)  Resp 20  Ht 5\' 3"  (1.6 m)  Wt 153 lb 14.4 oz (69.809 kg)  BMI 27.26 kg/m2  SpO2 100%  LMP 09/14/2012 (L)LE wrap intact, popliteal venous access site clean, dry, no hematoma Trace-1+edema noted.    Labs: CBC  Basename 09/17/12 0730 09/16/12 0243  WBC 3.7* 5.5  HGB 9.6* 9.1*  HCT 30.5* 28.3*  PLT 216 194   BMET  Basename 09/15/12 0254 09/14/12 1625  NA 137 134*  K 3.6 3.2*  CL 106 100  CO2 22 22  GLUCOSE 87 158*  BUN 5* 6  CREATININE 0.71 0.80  CALCIUM 7.9* 8.6   LFT  Basename 09/14/12 1625  PROT 7.1  ALBUMIN 3.4*  AST 18  ALT 14  ALKPHOS 55  BILITOT 0.3  BILIDIR --  IBILI --  LIPASE --   PT/INR  Basename 09/17/12 0730 09/16/12 0243  LABPROT 13.5 30.7*  INR 1.04 3.16*     IMPRESSION:  1.  Diagnostic venography demonstrates findings consistent with high-grade stenosis versus complete occlusion of the common iliac vein.  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  Additionally, there is obstructing acute thrombus  in the left common and external iliac veins, and extensive nonobstructing acute thrombus  in the common femoral, and femoral veins.  2.  Successful single session pharmacomechanical thrombectomy/thrombolysis with 10 mg TPA combined with AngioJet rheolytic mechanical thrombectomy and balloon maceration.  3.  Angioplasty and stenting of the chronic left common iliac vein stenosis with placement of a 14 x 80 mm self-expanding nitinol stent and restoration of in-line venous drainage from the left lower extremity to the inferior vena cava. 4. The patient will require systemic anticoagulation for 3 months. After anticoagulation is complete, she should take an aspirin daily for the remainder of her life. - 30 mmHg thigh-high compression hose to be  worn on the left lower extremity daily for the next 2 years to minimize the risk of post thrombotic syndrome - Follow up in IR Clinic in 2-4 weeks  Signed,  Sterling Big, MD Vascular & Interventional Radiologist [facility] Radiology   Original Report Authenticated By: Vilma Prader     LOS: 3 days    Brayton El PA-C 09/17/2012 11:23 AM

## 2012-09-30 ENCOUNTER — Other Ambulatory Visit: Payer: Self-pay | Admitting: Interventional Radiology

## 2012-09-30 DIAGNOSIS — I82409 Acute embolism and thrombosis of unspecified deep veins of unspecified lower extremity: Secondary | ICD-10-CM

## 2012-10-16 ENCOUNTER — Other Ambulatory Visit: Payer: Self-pay | Admitting: Interventional Radiology

## 2012-10-16 ENCOUNTER — Ambulatory Visit
Admission: RE | Admit: 2012-10-16 | Discharge: 2012-10-16 | Disposition: A | Discharge status: Home / Self Care | Payer: Self-pay | Source: Ambulatory Visit | Attending: Interventional Radiology | Admitting: Interventional Radiology

## 2012-10-16 DIAGNOSIS — I82409 Acute embolism and thrombosis of unspecified deep veins of unspecified lower extremity: Secondary | ICD-10-CM

## 2012-10-17 ENCOUNTER — Other Ambulatory Visit: Payer: Self-pay | Admitting: Interventional Radiology

## 2012-10-17 DIAGNOSIS — I82409 Acute embolism and thrombosis of unspecified deep veins of unspecified lower extremity: Secondary | ICD-10-CM

## 2012-10-17 DIAGNOSIS — I871 Compression of vein: Secondary | ICD-10-CM

## 2012-10-17 NOTE — Progress Notes (Signed)
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 She underwent successful venous thrombolysis and mechanical thrombectomy with stenting of her underlying iliac vein stenosis on 09/15/2012. She presents today for follow up.  She has been doing an excellent job wearing her TED hose daily.  She has not yet acquired a 30 mmHg thigh-high compression hose.  She is currently taking Xarelto. Her left lower extremity swelling has completely resolved. She does report some occasional twinges of pain in the popliteal fossa in the region of the access site, as well as occasional pain and mild swelling at her lateral malleolus.  These symptoms have been intermittent and self-limited. She takes ibuprofen occasionally but does not require any additional pain medicine. On review of symptoms, the only other complaint is occasional tightness in her back which she feels is new since the procedure. Again, this is not significant and self limited. She denies shortness of breath, chest pains, tingling in her left foot/toes, or other abnormality.  Review of Systems: A14 point review of symptoms was negative save for the pertinent positives listed in the HPI above.  Physical Exam: Vital signs: Blood pressure 128/90, heart rate 71 beats per minute, respiratory rate 16, oxygen saturation 1 inch percent on room air, temperature 97.8 degrees Cardiac: Regular rate and rhythm, no murmurs or gallops Lungs: Clear to auscultation bilaterally Lower extremity evaluation: Symmetric legs size bilaterally. No significant edema from the thigh to the ankle of either extremity. The left popliteal fossa venous access site  is well healed. There is mild tenderness to palpation in the popliteal fossa. No palpable cord, area of fluctuance, induration, redness, or pulsatile mass identified. Pulses: 2+ palpable PT and DP pulses bilaterally  Imaging:  Left lower extremity venous duplex ultrasound performed in clinic today demonstrates no evidence of residual or recurrent DVT.  Normal respiratory phasicity in the common femoral vein suggests patency of the more proximal iliac vein stent.  Assessment and Plan:  Kirsten Edwards is doing very well status post single session pharmacomechanical thrombolysis/thrombectomy of her left iliac and femoral veins with primary stenting of her left common iliac vein stenosis.  1. Return to clinic in 4 weeks to evaluate for resolution of her mild residual symptoms of tenderness in the popliteal fossa, intermittent back tightness and occasional swelling of the lateral malleolus. 2. I have given her a prescription for 20 - 30 mmHg thigh-high compression hose. She should continue to where these daily on the left lower extremity until October of 2015. 3. Continue Xarelto for now. 4. Scheduled for outpatient venogram of her left iliac stent in 6 months to assess patency. They were stent is widely patent at that time without any evidence of residual thrombus, she will be able to transition off of her Xarelto anticoagulation began mid daily aspirin therapy.  Signed,  Sterling Big, MD Vascular & Interventional Radiologist [facility] Radiology

## 2012-11-20 ENCOUNTER — Ambulatory Visit
Admission: RE | Admit: 2012-11-20 | Discharge: 2012-11-20 | Disposition: A | Discharge status: Home / Self Care | Payer: No Typology Code available for payment source | Source: Ambulatory Visit | Attending: Interventional Radiology | Admitting: Interventional Radiology

## 2012-11-20 DIAGNOSIS — I871 Compression of vein: Secondary | ICD-10-CM

## 2012-11-20 DIAGNOSIS — I82409 Acute embolism and thrombosis of unspecified deep veins of unspecified lower extremity: Secondary | ICD-10-CM

## 2012-11-25 NOTE — Progress Notes (Signed)
Interventional Radiology Established Patient Clinic Note   Chief Complaint: Follow up for May-Thurner syndrome and acute left lower extremity DVT status post lacing and iliac vein stenting  History: 46 year old female who presented acutely in late September with left lower extremity DVT in the setting of May- Thurner syndrome. She was successfully treated with a single session venous lysis with stenting of her underlying iliac vein compression. She returns today for her second scheduled follow-up.  She reports continued relief of her left lower extremity leg swelling. She has occasional sharp pains in the posterior left calf, and occasionally on the top of the foot. These are transient and self-limited. She has continued to wear her thigh high compression hose on her left leg as instructed. She continues to take her Xarelto and denies any issues with bleeding complications.  She had a question about the medication, initially the dosing is for 15 mg p.o. twice a day, and then after 22 days it becomes 20 mg orally once daily. She was concerned about the switch from 2 pills to one. I explained that this is routine for this medicine.  On examination, her leg swelling has completely resolved. Her popliteal fossa access site is well healed. I suspect the mild pains she is having in the popliteal fossa may be related to some scar tissue related to the venous access site. This appears to be fairly mild and is not as much concern for her.  On review of symptoms, she does note that she has had about 15 pounds of weight loss over the past several months. She attributes this to the stress of her recent illness. She denies fatigue, night sweats , fevers, chills, cough, hemoptysis, abdominal pain, or any other constitutional symptoms.  Exam: Vital signs Blood pressure: 150/96 Heart rate: 82 Respiratory rate: 14 Temperature: 98.2 degrees Fahrenheit Oxygen saturation: 99% on room air General: Alert and  oriented, in no acute distress. Pulmonary: Lungs are clear to auscultation bilaterally Cardiac: Regular rate rhythm Left lower extremity focused exam: No evidence of left lower extremity swelling, discoloration or skin irregularity. The venous access site in the popliteal fossa is well healed. There is no palpable abnormality in the calf, or along the dorsum of the foot.  Strength sensation are intact. Excellent palpable pulses at the dorsalis pedis and posterior tibial locations.  Assessment and Plan:  46 year old female with a history of May-Thurner syndrome and acute left lower extremity DVT now 9 weeks status post venous thrombolysis and stenting of her iliac vein compression.  1. DVT Currently doing extremely well with complete resolution of her left lower extremity edema and of no clinical signs to suggest narrowing of thrombosis over iliac veins stents. We will continue her Xarelto into over 18-month follow-up appointment with at which time mild to transition her to once daily aspirin. She will continue to where her thigh-high compression hose for at least 2 years.  2. Weight loss Mrs. Marland attributes this to stress, and her recent illness. I did discuss with her that any unintentional weight loss always raises warning flags for the potential underlying malignancy.  After careful questioning, she denies any systemic symptoms that would indicate a potential underlying issue. Given that her 39-month return visit is little more than 4 months away, rather than pursuing a pelvic venogram or Doppler ultrasound study I will order a CT scan of the abdomen and pelvis with a delayed venous phase.  This will evaluate her iliac vein stent and also screen for any other potential  underlying problems. I explained that if her weight loss continues, or if she develops any symptoms of concern over the next 4 months she should contact me directly and we will get her scan done more expediently.  Signed,  Sterling Big, MD Vascular & Interventional Radiologist [facility] Radiology

## 2013-02-27 ENCOUNTER — Other Ambulatory Visit (HOSPITAL_COMMUNITY): Payer: Self-pay | Admitting: Interventional Radiology

## 2013-02-27 DIAGNOSIS — I871 Compression of vein: Secondary | ICD-10-CM

## 2013-02-27 DIAGNOSIS — I82402 Acute embolism and thrombosis of unspecified deep veins of left lower extremity: Secondary | ICD-10-CM

## 2013-03-06 ENCOUNTER — Other Ambulatory Visit (HOSPITAL_COMMUNITY): Payer: Self-pay | Admitting: Interventional Radiology

## 2013-03-06 DIAGNOSIS — I871 Compression of vein: Secondary | ICD-10-CM

## 2013-03-06 DIAGNOSIS — I82402 Acute embolism and thrombosis of unspecified deep veins of left lower extremity: Secondary | ICD-10-CM

## 2013-03-25 ENCOUNTER — Ambulatory Visit
Admission: RE | Admit: 2013-03-25 | Discharge: 2013-03-25 | Disposition: A | Discharge status: Home / Self Care | Payer: Self-pay | Source: Ambulatory Visit | Attending: Interventional Radiology | Admitting: Interventional Radiology

## 2013-03-25 DIAGNOSIS — I82402 Acute embolism and thrombosis of unspecified deep veins of left lower extremity: Secondary | ICD-10-CM

## 2013-03-25 DIAGNOSIS — I871 Compression of vein: Secondary | ICD-10-CM

## 2013-03-25 NOTE — Progress Notes (Signed)
Interventional Radiology Established Patient Clinic Note  Chief Complaint: Scheduled 77-month follow-up visit for left lower extremity DVT and May-Thurner syndrome  History: 47 year old female with a history of acute extensive thrombosis of the left lower extremity deep venous system in the setting of iliac vein compression consistent with May-Thurner syndrome. She underwent successful single session thrombolysis/thrombectomy with angioplasty and stenting of her left common and external iliac veins. She has subsequently done very well and presents today for her scheduled 29-month follow-up evaluation.  Again, her husband serves as her interpreter. Mrs. Dutil reports that she is doing very well. She has had no further issues with left lower extremity swelling, edema or pain. She continues to take her Xarelto and has only a few days left to complete her 54-month course of anticoagulation. At her last visit, she describes unintentional weight loss which was concerning to her. She has happy to report that this has resolved. She denies any constitutional or systemic symptoms such as fever, night sweats, abdominal pain or other issues. On review of symptoms, she does report that she has occasional bilateral knee pain with feelings of giving way and catching in her left knee. She denies any history of trauma or twisting to the knee.  Exam: General: Well-nourished, well developed African-American female and in no acute distress. Cardiac: Regular rate rhythm Lower extremities: Negative for edema. Examination of her knees demonstrates no focal crepitus or bony abnormality.  Assessment and Plan: 37 old female doing very well status post thrombolysis and thrombectomy of the left lower extremity DVT with additional angioplasty and stenting of her left iliac venous compression. She has now completed a 16-month course of anticoagulation.  After she finishes her current prescription of Xarelto, I have instructed her  to take an aspirin daily for the rest of her life. A recent study has demonstrated that extended use of lower extremity compression hose following successful DVT lysis is of limited utility. She can cease wearing her compression hose at this time.  Regarding the knee pain, this appears to be degenerative in nature.  She may have a meniscal tissue on the left. I offered to have her see and orthopedist for further evaluation. She would like to proceed conservatively for now. If her knee pain becomes more bothersome, she will contact me and I will arrange a referral.  I will plan to see her again in clinic in 1 year with a final follow up ultrasound.  Signed,  Sterling Big, MD Vascular & Interventional Radiologist [facility] Radiology

## 2014-02-25 ENCOUNTER — Other Ambulatory Visit (HOSPITAL_COMMUNITY): Payer: Self-pay | Admitting: Interventional Radiology

## 2014-02-25 DIAGNOSIS — I871 Compression of vein: Secondary | ICD-10-CM

## 2014-02-25 DIAGNOSIS — Z86718 Personal history of other venous thrombosis and embolism: Secondary | ICD-10-CM

## 2014-04-02 ENCOUNTER — Ambulatory Visit
Admission: RE | Admit: 2014-04-02 | Discharge: 2014-04-02 | Disposition: A | Discharge status: Home / Self Care | Payer: BC Managed Care – PPO | Source: Ambulatory Visit | Attending: Interventional Radiology | Admitting: Interventional Radiology

## 2014-04-02 ENCOUNTER — Other Ambulatory Visit: Payer: Self-pay

## 2014-04-02 ENCOUNTER — Telehealth: Payer: Self-pay | Admitting: Radiology

## 2014-04-02 DIAGNOSIS — I871 Compression of vein: Secondary | ICD-10-CM

## 2014-04-02 DIAGNOSIS — Z86718 Personal history of other venous thrombosis and embolism: Secondary | ICD-10-CM

## 2014-04-02 NOTE — Progress Notes (Signed)
30MO F/U LLE VENOUS LYISIS AND STENTING OF LT ILIAC VEIN// PT IS ONLY ON 81 MG ASA, NO COMPLAINTS OF PAIN, SWELLING AND IS NOT WEARING THE COMPRESSION STOCKING PER PT HUSBAND.

## 2014-04-02 NOTE — Telephone Encounter (Signed)
Called ortho office to refer pt for LBP w/ radiculopathy of bilat legs/// pt is scheduled to see Dr Almedia Balls for 04-08-14 at 9:15am Location is [address]  Will contact husband today with info.  Janith Lima, EMT

## 2014-05-14 ENCOUNTER — Other Ambulatory Visit: Payer: Self-pay | Admitting: Orthopedic Surgery

## 2014-05-14 DIAGNOSIS — R9389 Abnormal findings on diagnostic imaging of other specified body structures: Secondary | ICD-10-CM

## 2014-05-18 ENCOUNTER — Ambulatory Visit
Admission: RE | Admit: 2014-05-18 | Discharge: 2014-05-18 | Disposition: A | Discharge status: Home / Self Care | Payer: BC Managed Care – PPO | Source: Ambulatory Visit | Attending: Orthopedic Surgery | Admitting: Orthopedic Surgery

## 2014-05-18 DIAGNOSIS — R9389 Abnormal findings on diagnostic imaging of other specified body structures: Secondary | ICD-10-CM

## 2014-05-26 ENCOUNTER — Telehealth: Payer: Self-pay | Admitting: Emergency Medicine

## 2014-05-26 NOTE — Telephone Encounter (Signed)
Message left to return call to [place] at [phone] or Starla to set up new patient appointment.  Per Dr. Sabra Heck can have 06/09/14 at 4:00. Calling patient to offer appointment.

## 2014-05-27 NOTE — Telephone Encounter (Signed)
Through a [place] phone interpretor, I called and left a message to call back to schedule and appointment and to ask for Lavella Hammock or Olivia Mackie.

## 2014-06-09 ENCOUNTER — Encounter: Payer: Self-pay | Admitting: Obstetrics & Gynecology

## 2014-06-09 ENCOUNTER — Ambulatory Visit (INDEPENDENT_AMBULATORY_CARE_PROVIDER_SITE_OTHER): Payer: BC Managed Care – PPO | Admitting: Obstetrics & Gynecology

## 2014-06-09 VITALS — BP 158/98 | HR 64 | Resp 16 | Ht 64.25 in | Wt 151.0 lb

## 2014-06-09 DIAGNOSIS — Z86718 Personal history of other venous thrombosis and embolism: Secondary | ICD-10-CM

## 2014-06-09 DIAGNOSIS — Z8611 Personal history of tuberculosis: Secondary | ICD-10-CM

## 2014-06-09 DIAGNOSIS — N83292 Other ovarian cyst, left side: Secondary | ICD-10-CM

## 2014-06-09 DIAGNOSIS — N83209 Unspecified ovarian cyst, unspecified side: Secondary | ICD-10-CM

## 2014-06-09 DIAGNOSIS — Z124 Encounter for screening for malignant neoplasm of cervix: Secondary | ICD-10-CM

## 2014-06-09 NOTE — Progress Notes (Signed)
48 y.o. G3P3 Married [place] F here for [facility] referral exam.  Referred from Dr. Almedia Balls.  Pt has been experiencing signficant back pain--that has been present for 16 years.  She is being treated with anti-inflammatories with some success.  MRI was obtained.  Incidental finding of left complex adnexal mass, most consistent with dermoid.  Lesion is about 8 cm.  Pt does not speak [place].  She is accompanied by her husband and a [place] interpreter--Rose--who is provided by [employer].  Pt does have some general understanding of the MRI finding.  This was discussed much more in-depth with her today via translator.  Pt understands lesion is most likely benign.  Tumor marker was drawn today.  Pt has two interesting parts of her hx that were reviewed.  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  Husband reports she was treated for 3 months with Rifampin.  This was done due to + TB skin test.  He reports CXRs were negative.  No records in [facility].  They are unsure of who treated her.  He feels very certain was [address] Address.  I will need to find this out and obtained records.  Also, she had extensive DVT and SVT of left lower extremity.  Was treated with Xarelto for six months.  Did have stenting of vein by interventional radiology.  These records are in [facility].    No LMP recorded.          Sexually active: no  The current method of family planning is none.    Exercising: yes  walking Smoker:  no  Health Maintenance: Pap:  Not in the last 3 years History of abnormal Pap:  no MMG:  2012 Colonoscopy:  none BMD:   none TDaP:  Up to date per pt   reports that she has never smoked. She has never used smokeless tobacco. She reports that she drinks alcohol. She reports that she does not use illicit drugs.  Past Medical History  Diagnosis Date  . DVT (deep venous thrombosis) 9/13    left leg    Past Surgical History  Procedure Laterality Date  . Left leg  9/13     DVT     Current Outpatient Prescriptions  Medication Sig Dispense Refill  . ASPIRIN PO Take by mouth daily.      . meloxicam (MOBIC) 15 MG tablet 15 mg daily.       No current facility-administered medications for this visit.    History reviewed. No pertinent family history.  ROS:  Pertinent items are noted in HPI.  Otherwise, a comprehensive ROS was negative.  Exam:   BP 158/98  Pulse 64  Resp 16  Ht 5' 4.25" (1.632 m)  Wt 151 lb (68.493 kg)  BMI 25.72 kg/m2    Height: 5' 4.25" (163.2 cm)  Ht Readings from Last 3 Encounters:  06/09/14 5' 4.25" (1.632 m)  09/16/12 5\' 3"  (1.6 m)    General appearance: alert, cooperative and appears stated age Head: Normocephalic, without obvious abnormality, atraumatic Neck: no adenopathy, supple, symmetrical, trachea midline and thyroid normal to inspection and palpation Lungs: clear to auscultation bilaterally Heart: regular rate and rhythm Abdomen: soft, non-tender; bowel sounds normal; no masses,  no organomegaly Extremities: extremities normal, atraumatic, no cyanosis or edema Skin: Skin color, texture, turgor normal. No rashes or lesions Lymph nodes: Cervical, supraclavicular, and axillary nodes normal. No abnormal inguinal nodes palpated Neurologic: Grossly normal  Pelvic: External genitalia:  no  lesions              Urethra:  normal appearing urethra with no masses, tenderness or lesions              Bartholins and Skenes: normal                 Vagina: normal appearing vagina with normal color and discharge, no lesions              Cervix: no lesions              Pap taken: yes Bimanual Exam:  Uterus:  normal size, contour, position, consistency, mobility, non-tender              Adnexa: Pelvic mass felt more midline than on the left.  aprpox 10cm mass noted.               Rectovaginal: Confirms               Anus:  normal sphincter tone, no lesions  A:  8 cm left adnexal mass, most consistent with dermoid on both MRI and  Ultrasound H/O extensive LLE DVT H/O + PPD with treatment with Rifampin  P:   Pap obtained Ca-125 obtained Will need to get records regarding TB treatment Laparoscopic LSO vs laparotomy d/w pt.  She wants me to do "what is best".  Will need Lovenox, most likely 6 weeks but will discuss with hematology first.  Will need to make sure medication is set up before surgery so there is no risks of pt being anticoagulated peri-operatively.   An After Visit Summary was printed and given to the patient.

## 2014-06-11 LAB — CA 125: CA 125: 15.1 U/mL (ref 0.0–30.2)

## 2014-06-12 LAB — IPS PAP TEST WITH HPV

## 2014-07-06 ENCOUNTER — Telehealth: Payer: Self-pay | Admitting: *Deleted

## 2014-07-06 DIAGNOSIS — D689 Coagulation defect, unspecified: Secondary | ICD-10-CM

## 2014-07-06 NOTE — Telephone Encounter (Signed)
Message copied by Jaymes Graff on Mon Jul 06, 2014  3:50 PM ------      Message from: Megan Salon      Created: Sun Jul 05, 2014  1:42 PM       Gay Filler,      This is the lady who needs surgical recommendations from Dr. Marin Olp due to hx of extensive clotting.  Husband speaks [place].  She only speaks [place].  Thanks.  You can start a surgical sheet on her as she will need laparoscopic LSO, possible laparotomy. ------

## 2014-07-06 NOTE — Telephone Encounter (Signed)
Kirsten Edwards, please refer to Dr Marin Olp. She will also be having surgery so we can precet her for [id number], Sabra Heck with [facility] assist, out-patient at [facility].

## 2014-07-09 NOTE — Telephone Encounter (Signed)
Call to patient's husband

## 2014-07-09 NOTE — Telephone Encounter (Signed)
Call to patient's husband who translates for patient. Notified blood test was normal and that referral to Hematologist has been initiated, should expect call with appointment info. Also working on precert for surgery to be scheduled.Call if questions.

## 2014-07-14 NOTE — Telephone Encounter (Signed)
Patient's husband calling to see when his wife is having surgery. They will be out of the country 08/06/14 through 09/04/14 for a funeral in [place].

## 2014-07-14 NOTE — Telephone Encounter (Signed)
In process of precert. Will forward to sabrina

## 2014-07-14 NOTE — Telephone Encounter (Signed)
Spoke with patients husband, Favour. Advised that per benefit quote received, they would need to pay $1610.70 to our office for the surgeons portion of the surgery. Explained that they are responsible for this amount because they have a $2500 calendar year deductible and 0 of that deductible has been met. Explained that it is our office policy that surgery payment be collected at least 2 weeks prior to the scheduled surgery date. Mr Mumme expressed understanding, but stated that he does not have this kind of money and would like to know what we can do to help him. When asked how much he could pay upfront, he said $600.

## 2014-07-15 NOTE — Telephone Encounter (Signed)
Call from husband. Discussed options of referral to [facility] (but may not provide much benefit due to having private insurance) versus possible payment arrangements. Husband states they can pay $600 up front then balance in 3 months. They would like surgery ASAP as need to travel to [place] for funeral 08-06-14 thru 09-04-14.  First available date 07-27-14. Would she be ok to travel to [place] 10 days post op?  If not, how long can they delay surgery? Advised will check with administration regarding payment arrangements. Please advise.

## 2014-07-15 NOTE — Telephone Encounter (Signed)
Do you know what kind of resources are available at the hospital?  It is fine with me do a payment plan with this couple.  It is fine to do it over a year.  We can discuss tomorrow.

## 2014-07-15 NOTE — Telephone Encounter (Signed)
Return call to patient's husband regarding options for surgery. LMTCB.

## 2014-07-15 NOTE — Telephone Encounter (Signed)
fyi

## 2014-07-16 NOTE — Telephone Encounter (Signed)
Call to husband. Advised of Dr Ammie Ferrier instruction. With  her history of clot recommend delay surgery till after trip. Also still need appointment with Dr Marin Olp.  Voiced understanding.

## 2014-07-16 NOTE — Telephone Encounter (Signed)
She has a hx of massive clot.  Should not do surgery before going on trip.  Will need to plan when she returns.  Ok to travel.  This dermoid is not new.

## 2014-07-22 ENCOUNTER — Telehealth: Payer: Self-pay | Admitting: *Deleted

## 2014-07-22 NOTE — Telephone Encounter (Signed)
Referral message from Dr Antonieta Pert office. They have appt for patient this Friday but have been unable to reach her. Requesting we call as well. Call to patient's husband, LMTCB ask for [facility]. ( since I am out of office tomorrow).

## 2014-07-23 ENCOUNTER — Telehealth: Payer: Self-pay | Admitting: Hematology & Oncology

## 2014-07-23 NOTE — Telephone Encounter (Signed)
Spoke w NEW PATIENT today to remind them of their appointment with Dr. Ennever. Also, advised them to bring all medication bottles and insurance card information. ° °

## 2014-07-23 NOTE — Telephone Encounter (Signed)
Message left to return call to [place] at [phone].   Voicemail is husband's name. Okay per designated party release form

## 2014-07-24 ENCOUNTER — Ambulatory Visit: Payer: Self-pay | Admitting: Hematology & Oncology

## 2014-07-24 ENCOUNTER — Ambulatory Visit (HOSPITAL_BASED_OUTPATIENT_CLINIC_OR_DEPARTMENT_OTHER): Payer: BC Managed Care – PPO | Admitting: Lab

## 2014-07-24 ENCOUNTER — Other Ambulatory Visit: Payer: Self-pay | Admitting: Lab

## 2014-07-24 ENCOUNTER — Ambulatory Visit (HOSPITAL_BASED_OUTPATIENT_CLINIC_OR_DEPARTMENT_OTHER): Payer: BC Managed Care – PPO | Admitting: Family

## 2014-07-24 ENCOUNTER — Ambulatory Visit: Payer: Self-pay

## 2014-07-24 ENCOUNTER — Ambulatory Visit: Payer: BC Managed Care – PPO

## 2014-07-24 ENCOUNTER — Encounter: Payer: Self-pay | Admitting: Family

## 2014-07-24 VITALS — BP 154/108 | HR 70 | Temp 97.9°F | Resp 14 | Ht 64.0 in | Wt 149.0 lb

## 2014-07-24 DIAGNOSIS — I871 Compression of vein: Secondary | ICD-10-CM

## 2014-07-24 DIAGNOSIS — Z86718 Personal history of other venous thrombosis and embolism: Secondary | ICD-10-CM

## 2014-07-24 DIAGNOSIS — D638 Anemia in other chronic diseases classified elsewhere: Secondary | ICD-10-CM

## 2014-07-24 DIAGNOSIS — I82429 Acute embolism and thrombosis of unspecified iliac vein: Secondary | ICD-10-CM

## 2014-07-24 DIAGNOSIS — I824Y9 Acute embolism and thrombosis of unspecified deep veins of unspecified proximal lower extremity: Secondary | ICD-10-CM

## 2014-07-24 LAB — CBC WITH DIFFERENTIAL (CANCER CENTER ONLY)
BASO#: 0 10*3/uL (ref 0.0–0.2)
BASO%: 0.9 % (ref 0.0–2.0)
EOS%: 3.6 % (ref 0.0–7.0)
Eosinophils Absolute: 0.1 10*3/uL (ref 0.0–0.5)
HEMATOCRIT: 41.2 % (ref 34.8–46.6)
HEMOGLOBIN: 13.5 g/dL (ref 11.6–15.9)
LYMPH#: 1.8 10*3/uL (ref 0.9–3.3)
LYMPH%: 54.4 % — ABNORMAL HIGH (ref 14.0–48.0)
MCH: 26.7 pg (ref 26.0–34.0)
MCHC: 32.8 g/dL (ref 32.0–36.0)
MCV: 82 fL (ref 81–101)
MONO#: 0.3 10*3/uL (ref 0.1–0.9)
MONO%: 8 % (ref 0.0–13.0)
NEUT#: 1.1 10*3/uL — ABNORMAL LOW (ref 1.5–6.5)
NEUT%: 33.1 % — AB (ref 39.6–80.0)
Platelets: 220 10*3/uL (ref 145–400)
RBC: 5.05 10*6/uL (ref 3.70–5.32)
RDW: 14.2 % (ref 11.1–15.7)
WBC: 3.4 10*3/uL — AB (ref 3.9–10.0)

## 2014-07-24 LAB — IRON AND TIBC CHCC
%SAT: 25 % (ref 21–57)
Iron: 79 ug/dL (ref 41–142)
TIBC: 314 ug/dL (ref 236–444)
UIBC: 235 ug/dL (ref 120–384)

## 2014-07-24 LAB — FERRITIN CHCC: Ferritin: 98 ng/ml (ref 9–269)

## 2014-07-24 MED ORDER — RIVAROXABAN 10 MG PO TABS: 5.0000 mg | ORAL_TABLET | Freq: Every day | ORAL | Status: DC

## 2014-07-24 NOTE — Progress Notes (Signed)
Hematology/Oncology Consultation   Name: Kirsten Edwards      MRN: [id number]    Location: Room/bed info not found  Date: 07/24/2014 Time:3:31 PM   REFERRING PHYSICIAN:  [place] CONSULT: "Needs surgical recommendations due to history of extensive clotting"   DIAGNOSIS:   1. 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 Her left lower extremity DVT appear 2 years ago and she was treated with Xarelto for 6 months. She has not had any other blood clots since this. She immigrated from [place] 3 years ago with her husband. She works for the [employer] in [place]. She speaks [place] so all information is being obtained from her husband and also an interpretor. She has no family history of blood abnormalities or cancer. She denies headache, dizziness, fever, chills, n/v, cough, rash, chest pain, palpitations, abdominal pain, constipation, diarrhea, blood in urine or stool. No swelling, tenderness, numbness or tingling in extremities. Her appetite is good. An ultrasound of her pelvis showed a mass on her right ovary. Her gynecologist would like to proceed with a laparoscopic oopherectomy in September but is worried about the patient's clotting issues.   ROS: All other 10 point review of systems is negative except for those issues mentioned above.   PAST MEDICAL HISTORY:   Past Medical History  Diagnosis Date  . DVT (deep venous thrombosis) 9/13    left leg   ALLERGIES: No Known Allergies    MEDICATIONS:  Current Outpatient Prescriptions on File Prior to Visit  Medication Sig Dispense Refill  . ASPIRIN PO Take 325 mg by mouth daily.       . meloxicam (MOBIC) 15 MG tablet Take 15 mg by mouth daily.        No current facility-administered medications on file prior to visit.   PAST SURGICAL HISTORY Past  Surgical History  Procedure Laterality Date  . Left leg  9/13     DVT   FAMILY HISTORY: No family history on file.  SOCIAL HISTORY:  reports that she has never smoked. She has never used smokeless tobacco. She reports that she drinks alcohol. She reports that she does not use illicit drugs.  PERFORMANCE STATUS: The patient's performance status is 0 - Asymptomatic  PHYSICAL EXAM: Most Recent Vital Signs: Blood pressure 154/108, pulse 70, temperature 97.9 F (36.6 C), temperature source Oral, resp. rate 14, height 5\' 4"  (1.626 m), weight 149 lb (67.586 kg). BP 154/108  Pulse 70  Temp(Src) 97.9 F (36.6 C) (Oral)  Resp 14  Ht 5\' 4"  (1.626 m)  Wt 149 lb (67.586 kg)  BMI 25.56 kg/m2  General Appearance:    Alert, cooperative, no distress, appears stated age  Head:    Normocephalic, without obvious abnormality, atraumatic  Eyes:    PERRL, conjunctiva/corneas clear, EOM's intact, fundi    benign, both eyes        Throat:   Lips, mucosa, and tongue normal; teeth and gums normal  Neck:   Supple, symmetrical, trachea midline, no adenopathy;    thyroid:  no enlargement/tenderness/nodules; no carotid   bruit or JVD  Back:     Symmetric, no curvature, ROM normal, no CVA tenderness  Lungs:     Clear to auscultation bilaterally, respirations unlabored  Chest Wall:    No tenderness or deformity   Heart:  Regular rate and rhythm, S1 and S2 normal, no murmur, rub   or gallop     Abdomen:     Soft, non-tender, bowel sounds active all four quadrants,    no masses, no organomegaly        Extremities:   Extremities normal, atraumatic, no cyanosis or edema  Pulses:   2+ and symmetric all extremities  Skin:   Skin color, texture, turgor normal, no rashes or lesions  Lymph nodes:   Cervical, supraclavicular, and axillary nodes normal  Neurologic:   CNII-XII intact, normal strength, sensation and reflexes    throughout   LABORATORY DATA:  Results for orders placed in visit on 07/24/14  (from the past 48 hour(s))  CBC WITH DIFFERENTIAL ([place])     Status: Abnormal   Collection Time    07/24/14  9:35 AM      Result Value Ref Range   WBC 3.4 (*) 3.9 - 10.0 10e3/uL   RBC 5.05  3.70 - 5.32 10e6/uL   HGB 13.5  11.6 - 15.9 g/dL   HCT 41.2  34.8 - 46.6 %   MCV 82  81 - 101 fL   MCH 26.7  26.0 - 34.0 pg   MCHC 32.8  32.0 - 36.0 g/dL   RDW 14.2  11.1 - 15.7 %   Platelets 220  145 - 400 10e3/uL   NEUT# 1.1 (*) 1.5 - 6.5 10e3/uL   LYMPH# 1.8  0.9 - 3.3 10e3/uL   MONO# 0.3  0.1 - 0.9 10e3/uL   Eosinophils Absolute 0.1  0.0 - 0.5 10e3/uL   BASO# 0.0  0.0 - 0.2 10e3/uL   NEUT% 33.1 (*) 39.6 - 80.0 %   LYMPH% 54.4 (*) 14.0 - 48.0 %   MONO% 8.0  0.0 - 13.0 %   EOS% 3.6  0.0 - 7.0 %   BASO% 0.9  0.0 - 2.0 %  IRON AND TIBC [facility]     Status: None   Collection Time    07/24/14  9:35 AM      Result Value Ref Range   Iron 79  41 - 142 ug/dL   TIBC 314  236 - 444 ug/dL   UIBC 235  120 - 384 ug/dL   %SAT 25  21 - 57 %  FERRITIN [facility]     Status: None   Collection Time    07/24/14  9:35 AM      Result Value Ref Range   Ferritin 98  9 - 269 ng/ml     RADIOGRAPHY: No results found.     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 Her left lower extremity DVT appear 2 years ago and she was treated with Xarelto for 6 months. An ultrasound of her pelvis showed a mass on her right ovary. Her gynecologist would like to proceed with a laparoscopic oopherectomy in September but is worried about the patient's clotting issues.  Her labs today were unremarkable.  We will have her take Xarelto 5mg  daily for 3 weeks after surgery. Prescription was sent to her pharmacy. All questions were answered. The patient and her husband understand that they can come back here for any hematology/oncology related issues in the future and to call the clinic with any problems, questions or  concerns. We can certainly see her anytime if needed.  The patient discussed with and also seen by Dr.  Ennever and he is in agreement with the aforementioned.   [place] by Dr. Marin Olp as follows:    ADDENDUM:  I saw and examined the patient with Niketa Turner. I agree with the above. 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 I think this was taken care of with a stent. She had hypercoagulable studies done 2 years ago and they were normal.  As far as her postoperative anticoagulation, I probably would have her on Xarelto for 3 weeks after her surgery. Postoperatively low dose Xarelto the day after surgery. I would think that the risk of bleeding would be very low.  I don't see that any radiologic studies need to be done. I don't see that any additional blood work or coagulation studies need to be done.  I don't believe that we have to get her back to the office. If there are any issues in the future, we will be more than happy to see her.  We spent a good 45 minutes with her and her husband.Marland Kitchen

## 2014-07-27 NOTE — Telephone Encounter (Signed)
Patient was seen at Dr. Dicie Beam office on 07/24/14.  Will close encounter.

## 2014-07-28 LAB — HYPERCOAGULABLE PANEL, COMPREHENSIVE
ANTICARDIOLIPIN IGA: 4 U/mL (ref <22)
ANTICARDIOLIPIN IGG: 3 GPL U/mL (ref <23)
ANTICARDIOLIPIN IGM: 1 [MPL'U]/mL (ref <11)
ANTITHROMB III FUNC: 118 % (ref 76–126)
BETA-2-GLYCOPROTEIN I IGA: 8 A Units (ref <20)
BETA-2-GLYCOPROTEIN I IGM: 2 M Units (ref <20)
Beta-2 Glyco I IgG: 0 G Units (ref <20)
DRVVT: 34.3 secs (ref <42.9)
Lupus Anticoagulant: NOT DETECTED
PROTEIN C, TOTAL: 111 % (ref 72–160)
PTT LA: 33 s (ref 28.0–43.0)
Protein C Activity: 166 % — ABNORMAL HIGH (ref 75–133)
Protein S Activity: 127 % (ref 69–129)
Protein S Total: 126 % (ref 60–150)

## 2014-07-28 LAB — HEMOGLOBINOPATHY EVALUATION
HGB F QUANT: 0 % (ref 0.0–2.0)
HGB S QUANTITAION: 0 %
Hemoglobin Other: 0 %
Hgb A2 Quant: 2.6 % (ref 2.2–3.2)
Hgb A: 97.4 % (ref 96.8–97.8)

## 2014-07-29 ENCOUNTER — Telehealth: Payer: Self-pay | Admitting: *Deleted

## 2014-07-29 NOTE — Telephone Encounter (Addendum)
Message copied by Lenn Sink on Wed Jul 29, 2014  8:54 AM ------      Message from: Volanda Napoleon      Created: Tue Jul 28, 2014 10:25 PM       Call - blood clotting studies are normal.  pete ------Voicemail full. Unable to leave voicemail.

## 2014-07-29 NOTE — Telephone Encounter (Addendum)
Message copied by Lenn Sink on Wed Jul 29, 2014  9:43 AM ------      Message from: Burney Gauze R      Created: Tue Jul 28, 2014 10:25 PM       Call - blood clotting studies are normal.  pete ------Informed pt that blood clotting studies are normal.

## 2014-09-08 ENCOUNTER — Other Ambulatory Visit: Payer: Self-pay | Admitting: Lab

## 2014-09-08 ENCOUNTER — Ambulatory Visit: Payer: Self-pay | Admitting: Hematology & Oncology

## 2014-09-08 ENCOUNTER — Ambulatory Visit: Payer: Self-pay

## 2014-09-29 ENCOUNTER — Telehealth: Payer: Self-pay | Admitting: Obstetrics & Gynecology

## 2014-09-29 NOTE — Telephone Encounter (Signed)
Message left to return call to [place] at [phone] to patient's husband, Kirsten Edwards.   Will schedule consult with Dr. Sabra Heck.

## 2014-09-29 NOTE — Telephone Encounter (Signed)
Husband returning call. Needs consult with Dr. Sabra Heck regarding surgery.  Scheduled for 10/16/14 at 0915. Husband agreeable.  Routing to provider for final review. Patient agreeable to disposition. Will close encounter

## 2014-09-29 NOTE — Telephone Encounter (Signed)
This is the pt with large dermoid and DVT hx.  Saw Dr. Marin Olp in consultation.  Can you call?  I think he is probably calling to get surgery scheduled.

## 2014-09-29 NOTE — Telephone Encounter (Signed)
pts husband called to schedule an appointment with Dr Sabra Heck. He says it's to schedule surgery. DPR is on file to speak with him.

## 2014-10-08 ENCOUNTER — Telehealth: Payer: Self-pay | Admitting: *Deleted

## 2014-10-08 NOTE — Telephone Encounter (Signed)
Call to patient's husband. Advised Dr Sabra Heck reviewed previous call. Can proceed with scheduling surgery if desires and will schedule patient for surgery consult based on date of surgery. Husband states patient would prefer this. Available anytime. Patient doing well since trip. He states she has not had any pain. Will schedule surgery and call him back with date and adjust surgical appointments accordingly.

## 2014-10-13 NOTE — Telephone Encounter (Signed)
She is now scheduled for 11-10-14. I have not yet called them to review surgical instructions.

## 2014-10-13 NOTE — Telephone Encounter (Signed)
Pt spouse called during lunch asking for a call back. Lmtcb

## 2014-10-13 NOTE — Telephone Encounter (Signed)
Gay Filler is patient scheduled for surgery? Has consult 10/30 with Dr. Sabra Heck

## 2014-10-14 NOTE — Telephone Encounter (Signed)
Late entry. Return call to patient's husband Kirsten Edwards on 10-13-14 at 1700. Advised surgery date of 11-10-14 at 0730 at [facility] and he and patient are agreeable. Advised could keep scheduled consultation for this Friday 10-16-14. He requests to reschedule this appointment to closer to surgery date. All surgery appointments scheduled and surgical instruction sheet reviewed and printed copy mailed.

## 2014-10-14 NOTE — Telephone Encounter (Signed)
Left message for patient to call back. Need current insurance information for this patient. Per [employer], the coverage that we have on file terminated 08.31.2015.

## 2014-10-16 ENCOUNTER — Ambulatory Visit: Payer: BC Managed Care – PPO | Admitting: Obstetrics & Gynecology

## 2014-10-16 NOTE — Telephone Encounter (Signed)
Patient has no insurance.  I explained to the patient that the amount of $1610.70 was a quote of patient liability based on the insurance plan that was effective at the time of the conversation 07.28.2015. Advised that I will check with the biller to find what the 'self-pay' patient responsibility will now be. Advised that once I had that information, I would call him back and let him know what the expected patient liability will be and we will discuss payment at that point. Spouse agreeable.

## 2014-10-16 NOTE — Telephone Encounter (Signed)
Spoke with Kirsten Edwards. Per Kirsten Edwards, this patient has no insurance coverage at this time.

## 2014-10-16 NOTE — Telephone Encounter (Signed)
Left message for spouse, Favour, to call back. Need current coverage information.

## 2014-10-16 NOTE — Telephone Encounter (Signed)
Pts spouse called back during lunch

## 2014-10-16 NOTE — Telephone Encounter (Signed)
Self pay amount will be $2340.

## 2014-10-19 ENCOUNTER — Encounter: Payer: Self-pay | Admitting: Family

## 2014-10-19 NOTE — Telephone Encounter (Signed)
Spoke with patient. Advised that we are working on verifying self pay costs. Asked if they had intention of getting new coverage, he stated that his wife is no longer employed and coverage was with former employer.

## 2014-10-28 ENCOUNTER — Telehealth: Payer: Self-pay | Admitting: *Deleted

## 2014-10-28 NOTE — Telephone Encounter (Signed)
See next phone encounter for additional information regarding self pay options.   Routing to provider for final review. Patient agreeable to disposition. Will close encounter

## 2014-10-28 NOTE — Telephone Encounter (Signed)
See previous phone notes. Patient no longer has medical insurance and is scheduled for surgery on 11-10-14. Reviewed with Dr Sabra Heck need for surgery and expense to patient despite self-pay discount. Dr Sabra Heck spoke with physician at [facility], there are other options for patient in addition to the self pay discount.  Can offer patient referral if desires. Call to patient's husband, Favour, LMTCB.

## 2014-10-29 NOTE — Telephone Encounter (Signed)
Pt's husband Favour returning phone call.

## 2014-10-29 NOTE — Telephone Encounter (Signed)
Patient's husband returned call. Advised we are offering referral to clinic at [facility] so she can receive care she needs without incurring financial hardship to their family. Husband agreeable. Did not realize there would be costs in addition to Dr Sabra Heck charges. Advised if agreeable to referral, again, this is to help them get the care they need without incurring excessive expense, will cancel appointment here and surgery and make appointment at clinic.Marland Kitchen Husband agreeable.

## 2014-10-29 NOTE — Telephone Encounter (Signed)
Call to patient husband, LMTCB. Calling to provide information regarding other options prior to scheduled surgery consult tomorrow.

## 2014-10-30 ENCOUNTER — Ambulatory Visit: Payer: BC Managed Care – PPO | Admitting: Obstetrics & Gynecology

## 2014-11-03 NOTE — Telephone Encounter (Signed)
Referral coordinator working on appointment.

## 2014-11-05 NOTE — Telephone Encounter (Signed)
Call to patient's husband through [employer], interpreter service( Interpreter [phone] [place] language.) Interpreter service used to confirm Favour understands referral only made to prevent fiancial hardship. Advised referral has been made to [facility] and should receive call with appointment by early December. Advised to call office if does not receive appointment. Wished them well.  Routing to provider for final review. Patient agreeable to disposition. Will close encounter

## 2014-11-10 ENCOUNTER — Encounter (HOSPITAL_COMMUNITY): Admission: RE | Payer: Self-pay | Source: Ambulatory Visit

## 2014-11-10 ENCOUNTER — Ambulatory Visit (HOSPITAL_COMMUNITY)
Admission: RE | Admit: 2014-11-10 | Payer: BC Managed Care – PPO | Source: Ambulatory Visit | Admitting: Obstetrics & Gynecology

## 2014-11-10 SURGERY — SALPINGO-OOPHORECTOMY, LAPAROSCOPIC
Anesthesia: General

## 2014-12-03 ENCOUNTER — Ambulatory Visit: Payer: BC Managed Care – PPO | Admitting: Obstetrics & Gynecology

## 2014-12-23 ENCOUNTER — Telehealth: Payer: Self-pay | Admitting: Obstetrics & Gynecology

## 2014-12-23 NOTE — Telephone Encounter (Signed)
Patient's husband who identified himself as "Backs" called requesting to speak to the nurse about his wife. He declined to give any additional information.

## 2014-12-23 NOTE — Telephone Encounter (Signed)
Spoke with patient's husband Kirsten Edwards (okay per ROI). Kirsten Edwards states that he was told to call the office if he did not receive a call in regards to scheduling surgery with Dr.Miller for his wife. Advised patient's husband will need to check with Dr.Miller and surgery scheduling and have someone return call in regards to scheduling. Kirsten Edwards is agreeable.  Appears in EPIC patient was scheduled for Laparoscopic Salpingo Oophorectomy on 11/10/14 but it was cancelled.  Routing to Lamont Snowball for surgery scheduling review

## 2014-12-23 NOTE — Telephone Encounter (Signed)
Patient previously referred to [facility] since she is self pay in effort to prevent her from incurring large out of pocket cost. Dr Sabra Heck personally spoke with Dr Ihor Dow regarding patient's case and referral info was faxed by insurance coordinator on 11-03-14. Call to Dr Ihor Dow office number, all referrals must go clinic. Call to clinic, closed for the day. Call to patient's husband, Kirsten Edwards. Apologized for delay and thanked him for calling.  Advised I am checking on status of referral but their office is currently closed. Will be back in touch with him tomorrow.

## 2014-12-24 NOTE — Telephone Encounter (Signed)
After multiple failed attempts to send referral, finally able to confirm referral received and Kirsten Edwards has scheduled appointment for patient on Friday 01-01-15 at 10 am. They will have translator available for appointment.

## 2014-12-24 NOTE — Telephone Encounter (Signed)
Patient's husband, Favour, given appointment information, date and time and phone number to [facility] to get information regarding income verification and payment arrangements. Agreeable to appointment.  Routing to provider for final review. Patient agreeable to disposition. Will close encounter

## 2014-12-24 NOTE — Telephone Encounter (Signed)
Call to [place]. Referral has not been received. Can refax info now and they will send to physician for review.  All clinical info resent to [place] at 1015am.   Call to [place] at [phone], fax not received. Fax number confirmed. Resent from another fax machine within office.

## 2014-12-25 ENCOUNTER — Encounter: Payer: Self-pay | Admitting: *Deleted

## 2015-01-01 ENCOUNTER — Ambulatory Visit (INDEPENDENT_AMBULATORY_CARE_PROVIDER_SITE_OTHER): Payer: Self-pay | Admitting: Obstetrics & Gynecology

## 2015-01-01 ENCOUNTER — Encounter: Payer: Self-pay | Admitting: Obstetrics & Gynecology

## 2015-01-01 VITALS — BP 155/92 | HR 104 | Temp 98.8°F | Ht 63.75 in | Wt 161.7 lb

## 2015-01-01 DIAGNOSIS — N8320 Unspecified ovarian cysts: Secondary | ICD-10-CM

## 2015-01-01 DIAGNOSIS — N83209 Unspecified ovarian cyst, unspecified side: Secondary | ICD-10-CM

## 2015-01-01 NOTE — Progress Notes (Signed)
Subjective:     Patient ID: Kirsten Edwards, female   DOB: 23-Feb-1966, 49 y.o.   MRN: [id number]  HPI Pt presents for eval of right ov cyst noted at another providers ofc.  She was referred here doe to insurance issues.  Pt reports that she is not having any pain. Pt reports that she was initially having pain in her back and they did an MRI at [facility] ofc where they discovered a cyst on her ovary.  She denies pain in the pelvis at that time but, reports that she had pelvic pain 2 year prev. She denies ANY pelvic pain at present.    Past Medical History  Diagnosis Date  . DVT (deep venous thrombosis) 9/13    left leg   Past Surgical History  Procedure Laterality Date  . Left leg  9/13     DVT   Current Outpatient Prescriptions on File Prior to Visit  Medication Sig Dispense Refill  . ASPIRIN PO Take 325 mg by mouth daily.     . meloxicam (MOBIC) 15 MG tablet Take 15 mg by mouth daily.     . rivaroxaban (XARELTO) 10 MG TABS tablet Take 0.5 tablets (5 mg total) by mouth daily. DO NOT START UNTIL AFTER SURGERY! (Patient not taking: Reported on 01/01/2015) 21 tablet 0   No current facility-administered medications on file prior to visit.  No Known Allergies History   Social History  . Marital Status: Married    Spouse Name: N/A    Number of Children: N/A  . Years of Education: N/A   Occupational History  . Not on file.   Social History Main Topics  . Smoking status: Never Smoker   . Smokeless tobacco: Never Used     Comment: never used tobacco  . Alcohol Use: Yes     Comment: some  . Drug Use: No  . Sexual Activity: Yes    Birth Control/ Protection: None   Other Topics Concern  . Not on file   Social History Narrative        Review of Systems     Objective:   Physical Exam BP 155/92 mmHg  Pulse 104  Temp(Src) 98.8 F (37.1 C)  Ht 5' 3.75" (1.619 m)  Wt 161 lb 11.2 oz (73.347 kg)  BMI 27.98 kg/m2  LMP 08/25/2012 Pt in NAD Abd: soft, NT.  ND GU: EGBUS: no lesions Vagina: no blood in vault Cervix: no CMT Uterus: small, mobile Adnexa: no definitive masses palpated.  ?adnexal fullness on left side (midline).  nontender.        05/18/2014 CLINICAL DATA: Abnormal MR with cystic mass anterior to the uterus.  EXAM: TRANSABDOMINAL AND TRANSVAGINAL ULTRASOUND OF PELVIS  TECHNIQUE: Both transabdominal and transvaginal ultrasound examinations of the pelvis were performed. Transabdominal technique was performed for global imaging of the pelvis including uterus, ovaries, adnexal regions, and pelvic cul-de-sac. It was necessary to proceed with endovaginal exam following the transabdominal exam to visualize the uterus, endometrium, ovaries and adnexal regions.  COMPARISON: MR lumbar spine 04/29/2014.  FINDINGS: Uterus  Measurements: 6.2 x 4.0 x 5.1 cm. Mildly heterogeneous but no discrete fibroid.  Endometrium  Thickness: 6 mm. No focal abnormality visualized.  Right ovary  Measurements: A complex cystic in echogenic structure occupies the right ovary, measuring 7.7 x 5.4 x 6.2 cm. Normal ovarian tissue is not readily identified.  Left ovary  Measurements: 2.5 x 1.3 x 2.5 cm. Normal appearance/no adnexal mass.  Other findings  Trace free fluid.  IMPRESSION: Complex mass in the right adnexa without a separately seen right ovary. Imaging characteristics suggest a dermoid. Internal fat could be confirmed with CT abdomen pelvis, preferably with IV contrast, as clinically indicated. Cystic ovarian neoplasm cannot be definitively excluded.  06/09/2014 CA125 15.1 Assessment:     H/o ovarian cyst- now without symptoms. Need to eval persistence of ov cyst     Plan:     Repeat sono F/u based on sono results

## 2015-01-01 NOTE — Patient Instructions (Signed)
Ovarian Cyst An ovarian cyst is a fluid-filled sac that forms on an ovary. The ovaries are small organs that produce eggs in women. Various types of cysts can form on the ovaries. Most are not cancerous. Many do not cause problems, and they often go away on their own. Some may cause symptoms and require treatment. Common types of ovarian cysts include:  Functional cysts--These cysts may occur every month during the menstrual cycle. This is normal. The cysts usually go away with the next menstrual cycle if the woman does not get pregnant. Usually, there are no symptoms with a functional cyst.  Endometrioma cysts--These cysts form from the tissue that lines the uterus. They are also called "chocolate cysts" because they become filled with blood that turns brown. This type of cyst can cause pain in the lower abdomen during intercourse and with your menstrual period.  Cystadenoma cysts--This type develops from the cells on the outside of the ovary. These cysts can get very big and cause lower abdomen pain and pain with intercourse. This type of cyst can twist on itself, cut off its blood supply, and cause severe pain. It can also easily rupture and cause a lot of pain.  Dermoid cysts--This type of cyst is sometimes found in both ovaries. These cysts may contain different kinds of body tissue, such as skin, teeth, hair, or cartilage. They usually do not cause symptoms unless they get very big.  Theca lutein cysts--These cysts occur when too much of a certain hormone (human chorionic gonadotropin) is produced and overstimulates the ovaries to produce an egg. This is most common after procedures used to assist with the conception of a baby (in vitro fertilization). CAUSES   Fertility drugs can cause a condition in which multiple large cysts are formed on the ovaries. This is called ovarian hyperstimulation syndrome.  A condition called polycystic ovary syndrome can cause hormonal imbalances that can lead to  nonfunctional ovarian cysts. SIGNS AND SYMPTOMS  Many ovarian cysts do not cause symptoms. If symptoms are present, they may include:  Pelvic pain or pressure.  Pain in the lower abdomen.  Pain during sexual intercourse.  Increasing girth (swelling) of the abdomen.  Abnormal menstrual periods.  Increasing pain with menstrual periods.  Stopping having menstrual periods without being pregnant. DIAGNOSIS  These cysts are commonly found during a routine or annual pelvic exam. Tests may be ordered to find out more about the cyst. These tests may include:  Ultrasound.  X-ray of the pelvis.  CT scan.  MRI.  Blood tests. TREATMENT  Many ovarian cysts go away on their own without treatment. Your health care provider may want to check your cyst regularly for 2-3 months to see if it changes. For women in menopause, it is particularly important to monitor a cyst closely because of the higher rate of ovarian cancer in menopausal women. When treatment is needed, it may include any of the following:  A procedure to drain the cyst (aspiration). This may be done using a long needle and ultrasound. It can also be done through a laparoscopic procedure. This involves using a thin, lighted tube with a tiny camera on the end (laparoscope) inserted through a small incision.  Surgery to remove the whole cyst. This may be done using laparoscopic surgery or an open surgery involving a larger incision in the lower abdomen.  Hormone treatment or birth control pills. These methods are sometimes used to help dissolve a cyst. HOME CARE INSTRUCTIONS   Only take over-the-counter   or prescription medicines as directed by your health care provider.  Follow up with your health care provider as directed.  Get regular pelvic exams and Pap tests. SEEK MEDICAL CARE IF:   Your periods are late, irregular, or painful, or they stop.  Your pelvic pain or abdominal pain does not go away.  Your abdomen becomes  larger or swollen.  You have pressure on your bladder or trouble emptying your bladder completely.  You have pain during sexual intercourse.  You have feelings of fullness, pressure, or discomfort in your stomach.  You lose weight for no apparent reason.  You feel generally ill.  You become constipated.  You lose your appetite.  You develop acne.  You have an increase in body and facial hair.  You are gaining weight, without changing your exercise and eating habits.  You think you are pregnant. SEEK IMMEDIATE MEDICAL CARE IF:   You have increasing abdominal pain.  You feel sick to your stomach (nauseous), and you throw up (vomit).  You develop a fever that comes on suddenly.  You have abdominal pain during a bowel movement.  Your menstrual periods become heavier than usual. MAKE SURE YOU:  Understand these instructions.  Will watch your condition.  Will get help right away if you are not doing well or get worse. Document Released: 12/04/2005 Document Revised: 12/09/2013 Document Reviewed: 08/11/2013 [employer] Patient Information 2015 [employer]. This information is not intended to replace advice given to you by your health care provider. Make sure you discuss any questions you have with your health care provider.  

## 2015-01-08 ENCOUNTER — Ambulatory Visit (HOSPITAL_COMMUNITY): Admission: RE | Admit: 2015-01-08 | Payer: Self-pay | Source: Ambulatory Visit

## 2015-01-13 ENCOUNTER — Ambulatory Visit (HOSPITAL_COMMUNITY)
Admission: RE | Admit: 2015-01-13 | Discharge: 2015-01-13 | Disposition: A | Discharge status: Home / Self Care | Payer: Self-pay | Source: Ambulatory Visit | Attending: Obstetrics & Gynecology | Admitting: Obstetrics & Gynecology

## 2015-01-13 DIAGNOSIS — N949 Unspecified condition associated with female genital organs and menstrual cycle: Secondary | ICD-10-CM | POA: Insufficient documentation

## 2015-01-13 DIAGNOSIS — D270 Benign neoplasm of right ovary: Secondary | ICD-10-CM | POA: Insufficient documentation

## 2015-01-19 ENCOUNTER — Encounter: Payer: Self-pay | Admitting: Obstetrics & Gynecology

## 2015-02-17 ENCOUNTER — Ambulatory Visit (INDEPENDENT_AMBULATORY_CARE_PROVIDER_SITE_OTHER): Payer: Self-pay | Admitting: Obstetrics & Gynecology

## 2015-02-17 ENCOUNTER — Encounter: Payer: Self-pay | Admitting: Obstetrics & Gynecology

## 2015-02-17 VITALS — BP 158/91 | HR 72 | Temp 98.1°F | Ht 63.0 in | Wt 163.4 lb

## 2015-02-17 DIAGNOSIS — D279 Benign neoplasm of unspecified ovary: Secondary | ICD-10-CM

## 2015-02-17 NOTE — Patient Instructions (Addendum)
INTRODUCTION - en gyncologie, annexielles fait rfrence  la zone adjacente  la matrice qui contient le tube de l'ovaire et de [place], ainsi que les vaisseaux associs, des ligaments et du tissu conjonctif. Pathologie dans ce domaine peut aussi rsulter de l'utrus, de [place], rtropritoine, ou d'une maladie mtastatique [place] site, Proofreader sein ou de Chief Financial Officer.  Une masse dans le adnexa peut tre symptomatique ou de dcouverte fortuite. Certains vont rgresser spontanment; Museum/gallery curator.  Le diagnostic diffrentiel d'une femme avec une masse annexielle sera examine ici. L'valuation et la [employer] masses annexielles des femmes et des masses annexielles chez les enfants sont examins en dtail sparment. (Voir Approche du patient Forensic scientist masse annexielle et Les kystes ovariens et noplasmes chez les nourrissons, les enfants et les adolescents".)  Prvalence - Une masse annexielle peut tre trouv [employer] femmes de tous ges, des ftus aux personnes ges. La prvalence rapporte varie largement en fonction de la population tudie et les critres utiliss. Dans un chantillon alatoire de 335 femmes asymptomatiques gs de 25  40 ans, la prvalence ponctuelle d'une lsion annexielle sur l'examen chographique tait de 7,8 pour cent (prvalence de kystes ovariens 6,6 pour cent) [1]. Dans une autre srie, l'chographie transvaginale a t ralise sur 8794 femmes mnopauses asymptomatiques [employer] cadre de Proofreader gyncologique de routine et de 2,5 pour cent avaient simple adnexal kyste uniloculaire [2]. Madalyn Rob tude de 16,109 femmes dans le programme de dpistage du cancer de l'ovaire [facility] [place] a Licensed conveyancer des rsultats similaires [3].  La rpartition des types histologiques de masses annexielles dans une tude de plus de 600 femmes est Holiday representative (tableau 1) [4]  RISQUE  DE MALIGNANCY - La proccupation la plus grave quand une masse annexielle est dcouvert est la possibilit qu'elle est maligne. Les caractristiques qui augmentent la probabilit de malignit incluent:  ? Prpubre ou post-mnopausique femme  ? Une masse apparaissant complexe ou solide (sur l'imagerie)  ? prdisposition gntique connue  ? Prsence chez une femme connue pour avoir un cancer nongynecological (par exemple, du sein ou d'un cancer gastrique)  ? ascite   Chez les filles de moins de 15 ans, un pour cent lev de tumeurs de [employer] [5]. Le risque global de malignit d'une annexielles masse augmente avec l'ge aprs la pubert et est de 6  11 pour cent [employer] femmes prmnopauses et 29  35 pour cent [employer] femmes mnopauses [6].  Les Actor le diagnostic diffrentiel d'une masse complexe. A titre d'exemple, l'abcs tuboovarian a souvent un aspect complexe chographique qui serait inquitant pour [employer], mais la prsence de la fivre, une leucocytose et pelvienne aide de la tendresse pour Engineer, materials bon diagnostic. (Voir Epidmiologie, les manifestations cliniques et le diagnostic d'abcs tuboovarian".)  Les antcdents mdicaux est galement un facteur prdictif important dans le diagnostic diffrentiel. Une histoire personnelle de cancer du sein met la femme  un risque accru d'implication maligne de l'ovaire, soit d'une [place] ou cancer de l'ovaire primaire; cependant, la plupart des masses annexielles dans ces femmes sont bnignes. A titre d'exemple, une srie de 54 femmes atteintes d'un cancer du sein par la suite un diagnostic d'une masse annexielle (mais aucun signe de [place] dissmine) a rapport 113 (88 pour cent) taient des [employer], sept (5 pour cent) taient des tumeurs de faible potentiel malin, sept (5 pour cent) taient les cancers pithliaux  de l'ovaire, et deux (2 pour  cent) taient un cancer du sein mtastatique [7].  Ascite peuvent tre associs  ces deux processus bnignes et malignes, mais est plus frquente  la malignit. Dans une srie de 125 femmes prsentant une masse pelvienne, l'incidence de l'ascite tait de 5 sur 34 tumeurs bnignes de l'ovaire, les tumeurs ovariennes borderline 7 sur 12, et les tumeurs malignes de l'ovaire 41 sur 56 [8]. masses pelviennes bnignes qui peuvent tre associs  l'ascite comprennent fibromes, endomtriomes, goitre ovarien, et la tuberculose pelvienne. Les patients prsentant une ascite de foie, une maladie rnale ou cardiaque habituellement ont des niveaux levs de CA125, et peuvent galement tre trouvs  avoir une masse pelvienne, ou le raccourcissement msentrique sur la tomodensitomtrie. Ces patients reprsentent des problmes de gestion difficiles; laparoscopie par un gyncologue oncologue, ou avec un gyncologue-oncologue disponibles, peut permettre  ces patients sans [employer] gyncologique  identifier avec la chirurgie mini-invasive, vitant ainsi la morbidit de la laparotomie.  DIAGNOSTIC DIFFRENTIEL - L'tiologie probable d'une masse annexielle diffre selon les groupes d'ge.  Les nourrissons et les adolescents - L'tiologie, le diagnostic et la gestion des noplasmes ovariens du ftus  un groupe d'ge des adolescents sont examins sparment. (Voir Les kystes ovariens et noplasmes chez les nourrissons, les enfants et les adolescents".)  Les femmes non mnopauses - Le diagnostic diffrentiel d'une adnexal masse dcouvert chez les femmes en ge de procrer est large, y compris physiologique ou kystes fonctionnels, grossesse extra-utrine, tiologies inflammatoires tels qu'un abcs tuboovarian, endometrioma, noplasmes ovariens bnins et malins, ou noplasmes mtastatique  l'ovaire (Tableau 2).  Physiologique / kystes fonctionnels - Dans le processus de l'ovulation normale, un follicule se dveloppe   maturit, puis les ruptures pour [employer] Financial un ovule; cette opration est suivie par la formation et l'involution [employer] jaune. kystes folliculaires surviennent lorsque la rupture ne se produit pas et le follicule ne cesse de crotre; corpus kystes luteum se produisent lorsque le corps jaune ne parvient pas  dveloppante et continue Buyer, retail (le corps jaune agrandit pour les six premires semaines de la grossesse et de doubler sa taille d'avant la grossesse [9]). Ces kystes sont donc appels physiologique ou fonctionnelle. Chaque type peut devenir hmorragique.  kystes folliculaires apparaissent lisse,  paroi mince, et uniloculaire  l'chographie (l'image 1A-B), tandis qu'un kyste luteum corpus peut sembler complexe et grossirement sont jaunes (image 2). Les kystes simples <2,5 cm de diamtre sont considrs comme des [employer]. Bien que les [employer] physiologiques peuvent devenir trs volumineux, ils sont encore le plus souvent moins de 10 cm. Ils sont gnralement asymptomatiques,  moins que des saignements ou une torsion se produit. La plupart spontanment rsoudre en quelques semaines, mais certains persistent pendant plusieurs mois.  Syndrome des ovaires polykystiques - le syndrome des ovaires polykystiques ([place]) doit tre envisage Health visitor patient de reproduction d'ge constat que les ovaires polykystiques par examen chographique (image 3). Le phnotype classique est une femme qui est obse, hirsutes et anovulatoire. Anovulation se caractrise par les menstrues rares mais lourdes et prolonges, et l'infertilit. 10 mL; calculated using"critres chographiques (utiliss pour les critres de [place]) considrs comme ayant une spcificit et une sensibilit suffisante pour dfinir SOPK sont la prsence de 12 follicules ou plus dans chaque ovaire mesurant 2  9 mm de diamtre et / ou l'augmentation du volume des ovaires (> 10 mL; calcules  l'aide la  formule 0,5 x longueur x largeur x paisseur). (Voir Les manifestations cliniques du syndrome des ovaires polykystiques [employer]  et [place] Comment diagnostic du syndrome des ovaires polykystiques chez les adultes et Le traitement du syndrome des ovaires polykystiques chez les adultes".)  tiologies lies  la grossesse - Les suivants sont des causes frquentes d'une adnexal masse spcifique aux femmes enceintes:  ? grossesse ectopique - Signes et symptmes vocateurs d'une grossesse extra-utrine comprennent une histoire d'une priode menstruelle manque, douleurs abdomino-pelviennes et des saignements vaginaux. La prsence d'une grossesse intra-utrine n'exclut totalement la possibilit d'une grossesse extra-utrine supplmentaire. (Voir Grossesse ectopique: Les manifestations cliniques et le diagnostic". Et "chographie de la grossesse de lieu inconnu")   ? kystes thcales lutine - thcales kystes de lutine (galement appels lutine kystes, hyperreactio luteinalis) sont lutinises kystes folliculaires qui forment  la suite de la surstimulation du taux d'hCG lev ou extrme sensibilit  la hCG. Bilatrales multiseptated masses annexielles kystique chez une femme avec une maladie gestationnelle trophoblastique, grossesse multiple, l'hyperstimulation ovarienne, ou une grossesse complique par [employer] susceptibles de reprsenter thque kystes de lutine, plutt que de malignit. Ils peuvent galement se produire dans une grossesse normale due  une hypersensibilit  des niveaux normaux de hCG. La plupart sont asymptomatiques, mais virilisation maternelle, hypermse, prclampsie, ou d'un dysfonctionnement de la thyrode peut se produire. Les [employer] progressivement semaines  quelques mois aprs la source de hCG est limin. (Voir Causes de hyperandrognie gestationnel".)   ? corpus luteum de la grossesse - une grossesse intra-utrine prcoce est toujours associe  un [employer]  corps [place], qui est typiquement infrieur  2,5 cm de diamtre. Cependant, le corps jaune peut parfois devenir largie et douloureuse due  [employer].   ? Luteoma - Luteoma est un changement de l'ovaire non noplasique associe  la grossesse qui peut simuler un noplasme  l'examen clinique, brut, ou microscopique [10]. Lutomes dveloppante spontanment aprs l'accouchement ou sont adquatement traites par [employer]. Le diagnostic doit tre suspect en prsence d'une masse annexielle solide et l'hirsutisme maternelle ou virilisation. (Voir Causes de hyperandrognie gestationnel".)   tiologies inflammatoires - maladie inflammatoire pelvienne chronique (PID) est une cause inflammatoire commune de annexielle ou masses pelviennes dans ce groupe d'ge. Les cas non traits ou sous-traite de rsultat PID des cicatrices ou matraquer du fimbriae tubaire. Cela conduit  une collection de scrtions soit tubaire ou pus, ce qui entrane une Hydrosalpinx ou pyosalpinx, respectivement. Lorsque l'ovaire est galement impliqu, un abcs tuboovarian ou complexe peuvent former (TOA / COT) (photo 1).  Constatations de douleurs abdomino-pelviennes, fivre, coulement cervical purulent, et cervical tendresse de mouvement en association avec une masse annexielle suggrent ce diagnostic. Un antcdent de maladie sexuellement transmissible (MST) est galement suggestive. Cervicale tendresse de mouvement peut tre obtenue avec une cause de pritonite aigu; il est pas spcifique de PID. (Voir La maladie inflammatoire pelvienne: Les manifestations cliniques et le diagnostic".)  tiologies inflammatoires Nongynecologic d'une masse annexielle comprennent une appendiculaire ou un abcs diverticulaire. (Voir "appendicite aigu chez les adultes: Les manifestations cliniques et le diagnostic diffrentiel et Les manifestations cliniques et le diagnostic de diverticulite aigu chez les  adultes".)  noplasmes ovariens bnins - noplasmes ovariens proviennent de l'pithlium de surface, les cellules germinales, et [employer] sexe cordon stromales et peuvent tre bnignes ou [place]. Ces noplasmes persistent  moins excise. Les diagnostics histopathologiques dans une srie de 1 femmes conscutives ges de 27 ans  39 ans qui a subi une chirurgie pour une masse annexielle persistante sont rpertoris [employer] tableau (tableau 1); 30 pour cent des patients taient mnopauses [11].  Les masses ovariennes bnignes les plus  courantes sont dcrites ci-dessous:  ? cystadnome sreux et mucineux - cystadnomes sreux et mucineux sont parmi les noplasmes ovariens bnins les plus courants. Ils sont  paroi mince, uni ou multiloculaire, et varient en taille de 5  plus de 20 cm. Par rapport  cystadnomes sreux, cystadnomes mucineux sont moins frquentes, sont plus susceptibles Child psychotherapist, sont plus grandes (ils peuvent atteindre une taille norme), et sont moins souvent bilatrale (moins de 5 contre 20  25 pour cent).   ? Endomtriome - Un endomtriome est une cause bnigne d'une masse ovarienne rsultant de la croissance du tissu endomtrial ectopique (image 2). Les patients souffrant d'endomtriose se plaignent souvent de la douleur pelvienne, la dysmnorrhe, et la dyspareunie. Un endomtriome, ou "kyste au chocolat," apparat comme une masse complexe  l'chographie. Il est galement une cause frquente d'un niveau de CA125 lev chez le patient prmnopause avec une masse annexielle. (Voir Diagnostic et [employer] endomtriomes ovariens".)   ? tratome kystique mature - Le tratome kystique mature (kyste dermode) est une tumeur des cellules germinales bnigne et est la tumeur de l'ovaire le plus courant dans les deuxime et troisime annes de la vie [12]. Dermodes peuvent contenir des [employer] de toutes les couches de cellules  trois germes; dents, les  [place], et le sbum sont des composants communs. Les patients [employer] tumeurs peuvent prouver de la douleur secondaire  l'largissement de l'ovaire, dversement du contenu dans la cavit pritonale, ou torsion ovarienne.   Une plaque plane de l'abdomen rvle souvent des composants calcifis, ce qui aide [employer] Corporation diagnostic diffrentiel (Image 4 et de l'image 3). L'chographie rvle habituellement une masse complexe (l'image 5A-C). Les tumeurs sont bilatrales dans 10  15 pour cent des patients. (Voir ovariens tumeurs des cellules germinales: [place], les manifestations cliniques et le diagnostic, section tratome kystique mature (kyste dermode) '.)  Un document de consensus de Theatre manager (SRU) en 2010 a indiqu que l'chographie transvaginale, complte par chographie transabdominale, tait la meilleure technique pour l'imagerie et la caractrisation d'un kyste adnexal [13]. caractristiques chographiques de kystes annexielles bnignes et [employer] t dcrites en dtail.  noplasmes ovariens malignes - L'incidence de cancer de l'ovaire chez les femmes de ce groupe d'ge ayant une masse annexielle varie de 6  11 pour cent [6]. La [employer] des tumeurs ovariennes primitives sont partiellement kystique et drivent de cellules pithliales, mais ils peuvent galement provenir d'autres types de cellules, comme les cellules germinales, cordons sexuels et du stroma et des types de cellules mixtes. (Voir pithliale carcinome de l'ovaire, des trompes de [place] et le pritoine: caractristiques cliniques et de diagnostic".) (Voir aussi l'examen de chaque sujet sur les cellules germinales et de sexe tumeurs de la moelle-stroma de l'ovaire).  L'ovaire peut aussi tre impliqu par [employer], en particulier au niveau du tractus gastro-intestinal ([place] de la tumeur) ou d'un cancer du sein. (Voir carcinome pithlial de l'ovaire, des trompes de  [place] et le pritoine: caractristiques cliniques et le diagnostic.", Section Hors d'un cancer primaire extraovarian ')  Liomyome - Un liomyome (fibrome) est une tumeur bnigne d'origine du muscle lisse, qui dcoule habituellement de l'utrus, mais peut galement tre trouve dans le ligament large (photo 4). La plupart des femmes ayant des fibromes symptomatiques [employer] 34s ou 43s. Les [employer] Financial cliniquement apparente Orma Render environ 25 pour cent des femmes en ge de procrer et notes sur un examen pathologique dans environ 80 pour cent de l'utrus chirurgicalement excises.  Les patients prsentent [employer]  plaintes de pression pelvienne, la douleur, la mnorragie, et [employer]. L'examen physique rvle habituellement, un utrus de forme irrgulire  plus grande chelle, qui apparat comme une tumeur ou des tumeurs solides de l'utrus lors de l'examen chographique. (Voir Epidmiologie, les manifestations cliniques, le diagnostic et [employer] utrins (fibromes)".)  Un fibrome dcoulant de l'utrus postrieur et faisant saillie dans la partie postrieure cul-de-sac (sac de [place]) ou en provenance du fond comme une masse pdicule peut tre confondue avec une tumeur ovarienne. dgnrescence kystique d'un fibrome peut entraner l'apparition d'une masse complexe  l'chographie (photo 5 et de l'image 6). Ceci, coupl [employer] fait que les fibromes peuvent entraner une lvation de la concentration de CA125 srique, entrane aussi avec proccupation que la masse peut tre un noplasme ovarien malin.  la dgnrescence sarcomateuse d'un myome utrin est rare, avec une incidence allant de 0,4  1,4 pour cent [14]. (Voir sarcome utrin: Classification, les manifestations cliniques et le diagnostic".)  kystes Paraovarian / paratubaire et autres noplasmes ligament tubaires et large - Tumeurs dcoulant de la trompe de [place] ou ligament large sont rares. Etant  donn que les tubes et les ligaments larges ne sont gnralement visualises sur l'chographie, la source de ces tumeurs peut tre attribu par erreur  l'ovaire ou de l'utrus, qui sont des sites les plus courants pour les noplasmes.  Les [employer] plus communes dans ce domaine sont des kystes simples qui proviennent des restes de paramsonphrotique ([place]) ou msonphrique [place]) conduits qui sont prsents au cours du dveloppement embryologique urognitale [15,16]. L'histologie de ces lsions peut galement tre msothliales. kystes paramsonphriques sont les plus courantes, [place], le kyste [employer] [17,18]. Un kyste hydatique de [place] est attach  la fimbriae tubaire et contient un liquide sreux entour par [employer] paroi translucide.  kystes paratubaire ou paraovarian peuvent galement tre noplasique. Dans une tude rtrospective de 59 femmes ayant subi une chirurgie pour les lsions kystiques paraovarian, 75 pour cent [employer] simples et 25 prsentaient des lsions noplasiques (sept cystadnomes et huit cystadenofibromas); il n'y avait pas de lsions malignes [19]. lsions paratubaire ou paraovarian malignes sont rares; une revue de la littrature a trouv 14 rapports de tumeurs pithliales malignes paraovarian ou borderline [20].  Ces kystes sont gnralement dcouverts fortuitement lors d'une chographie pelvienne ou la [place]. Lorsque les kystes paratubaire sont symptomatiques, Ecologist plus souvent une douleur pelvienne unilatrale comme terne [16]. Il n'y a pas de donnes concernant si ces kystes, soit bnignes ou malignes, sont plus frquents [employer] Inc femmes prmnopauses ou postmnopauses. Chez les femmes avec des kystes qui apparaissent simples  l'chographie et sont <10 cm de diamtre, aucune intervention ou de surveillance continue est ncessaire. Les femmes avec paratubaire / kystes paraovarian complexes devraient tre grs de la  mme [employer] femmes avec des kystes ovariens complexes. (Voir Approche du patient avec une masse annexielle".)  Un kyste inclusion peritoneale peut se produire  la suite d'adhrences pelviennes. Un ovaire est souvent contenu dans les kystes et les caractristiques des [employer] t dcrites [13]. Ces kystes peuvent sembler complexes, mais sont souvent asymptomatiques et non clairement palpable. Si le scnario et les rsultats cliniques sur l'imagerie sont compatibles avec une inclusion kyste pritonale, aucune intervention est indique et la surveillance peut tre envisage.  Les femmes mnopauses - L'incidence des augmentations de cancer de l'ovaire avec l'ge; au moins 30 pour cent des masses ovariennes chez les femmes de plus de 108 ans sont malignes [6]. Ainsi, une  masse ovarienne chez une femme mnopause devrait tre considre comme maligne jusqu' preuve du contraire. masses annexielles malignes incluent celles d'origine ovarienne primaire; lsions mtastatiques typiquement de l'endomtre, du sein et de l'appareil gastro-intestinal; et les carcinomes des trompes de Research scientist (medical).  tiologies nonmalignant comprennent beaucoup de ceux galement observ chez les patients en ge de procrer, comme Cystadnome, liomyomes, endometrioma et hydrosalpinx. La maladie diverticulaire doit tre considre [employer] femme plus ge qui prsente de la fivre, des douleurs pelviennes du ct gauche, et une masse pelvienne sur l'examen pelvien ou chographie. (Voir Les manifestations cliniques et le diagnostic de diverticulite aigu chez les adultes".)  kystes simples ne sont pas rares dans ce groupe d'ge. Ils reprsentent probablement soit physiologique persistante / kystes fonctionnels de la prmnopause ou l'activit ovarienne nonovulatory [2,21,22]. Dans un essai de dpistage du cancer de l'ovaire, plus de 15.000 femmes ges de plus de 31 ans ont subi une chographie transvaginale annuelle pendant  trois ans [23]. kystes simples ont t observs chez 14 pour cent des [employer] la premire chographie.  un an de suivi, l'incidence des nouveaux kystes simples tait de 8 pour cent. Parmi les ovaires avec un simple [employer] premier cran, 32 pour cent avaient aucune masse ovarienne, 62 pour cent avaient un ou plusieurs kystes simples, et 6 pour cent avaient une masse complexe ou solide.  carcinome ovarien - La majorit des [employer] de [employer] drives de l'pithlium coelomique, avec papillaire cystadenocarcinoma tant la plus courante. L'ge moyen au moment du diagnostic est de 50  60 ans. Les patients prsentent souvent des symptmes gastro-intestinaux vagues y compris la dyspepsie, la satit prcoce, l'anorexie, la constipation et les ballonnements; ces plaintes non spcifiques sont associs  une maladie avance. Les signes physiques peuvent inclure un panchement pleural, distension abdominale avec ascite, une masse abdomino-pelvien, et  l'aine adnopathie. Examen Rectovaginal rvle souvent nodularity de la partie postrieure cul-de-sac. (Voir carcinome pithlial de l'ovaire, des trompes de [place] et le pritoine: caractristiques cliniques et diagnostic".)  rsultats de l'chographie pelvienne incluent habituellement une masse annexielle complexe, soit unilatrale ou bilatrale, souvent accompagne d'ascite (image 7). (Voir diffrenciation chographique des masses bnignes par rapport annexielles malignes".)  constatations peropratoires typiques comprennent les ovaires nodulaires agrandies (photo 6) et diffusent la maladie intrapritonale.  carcinome tubaire [place] - Carcinome de la trompe de [place] est une cause rare d'une masse annexielle (photo 7). Les patients prsentent souvent des saignements post-mnopausique et les douleurs pelviennes, mais beaucoup ont peu ou pas de symptmes spcifiques. Certains patients prsenteront galement avec un liquide, coulement  abondant, vaginale. valuation par [employer] rvle une structure tubulaire "en forme de saucisse" dans le adnexa (Image 8) avec novascularisation sur les flux Doppler. concentration de CA125 srique est souvent leve dans le carcinome tubaire [place] et est [employer] Inc suivi des patients aprs le traitement primaire. (Voir pithliale carcinome de l'ovaire, des trompes de [employer] et le pritoine: Epidmiologie et facteurs de risque.)  carcinome mtastatique - Les ovaires et les trompes de [employer] sites communs de mtastases de l'endomtre, du sein, et certaines tumeurs malignes gastro-intestinales. (Voir carcinome pithlial de Midwife, des trompes de [employer] et le pritoine: caractristiques cliniques et le diagnostic.", Section Hors d'un cancer primaire extraovarian ')  kystes ovariens bnins - Des niveaux levs de gonadotrophines ou andrognes peuvent provoquer des structures bordes de petites Corporate treasurer l'ovaire  scrter un fluide dans leur cavit intrieure et agrandir pour [employer]. Ce n'est pas rare, surtout dans les premires annes aprs la  mnopause [2,21,22,24].  La prvalence d'un kyste uniloculaire simple, chez les femmes mnopauses varie de 2,5  18 pour cent, en fonction de la population et les critres utiliss (par exemple, <5 ou <10 cm).  ? Carleene Overlie srie de 8794 femmes mnopauses asymptomatiques offert chographie transvaginale au moment d'un examen gyncologique de routine, de 2,5 pour cent (215 patients) avait un kyste simple annexielle uniloculaire; 84 pour cent taient ?5 cm et 88 pour cent ont t manques  l'examen pelvien [2]. Les informations compltes de suivi tait disponible pour 149 de ces patients:    Quarante-cinq femmes ont subi une intervention chirurgicale pour la taille du kyste ?10 cm, CA125 ?35 UI / mL, ou la prfrence du patient et [employer] diagnostics suivants: cystadnome sreux (41), cystadnome mucineux (trois),  cystadenofibroma (trois), kyste paraovarian (un) et le cancer des ovaires (un). Le cancer de l'ovaire est survenue dans un kyste 3 cm associ  CA125 de 45 UI / mL.    Parmi les 104 patients suivis de faon conservatrice, 44 pour cent avaient la rsolution du kyste (temps mdian  la rsolution 80 mois, entre 6  65 mois), bien que certaines de ces femmes ont dvelopp par la suite de nouveaux kystes. Les autres patients ont eu des [employer] au long de l'tude, avec un suivi mdian de 10 mois.   ? Une srie de 15,106 femmes asymptomatiques ?1 ans d'ge a rapport des [employer] 18 pour cent [24]. 12 months."Environ 70 pour cent de ces kystes spontanment rsolu, la [employer] trois [place], [place] de 6,8 pour cent a persist> 12 mois. Aucune femme avec un unilocular tumeur ovarienne kystique isole a dvelopp un cancer de Investment banker, corporate.   ? Une tude d'autopsie de 52 femmes mnopauses (mdiane de 8 ans) qui sont morts de Theatre stage manager nongynecological trouv kystes ovariens dans 36 (15 pour cent) [22]. la taille de Cyst comprise entre 5 et 75 mm; tous taient bnins, sauf pour un cas de cystadnomes bilatraux sreuses de faible potentiel malin. En outre, 11 femmes avaient des [employer].   Le faible risque de malignit dans ces tudes et d'autres permet de tenir compte de la gestion prudente des kystes uniloculaires simples. (Voir Approche du patient avec une masse annexielle".)  Les autres tiologies - liomyomes peut persister dans les annes de la mnopause, mais les nouveaux myomes ne dveloppent gnralement pas.  D'autres causes d'une masse annexielle comprennent des lsions inflammatoires du tube digestif (en particulier de diverticulite et, [employer], l'appendicite), les kystes pritonale et piploques, masses intraligamentous et diverses lsions rtropritonales.  La prvalence de la maladie diverticulaire dans la  population gnrale est dpendant de l'ge, passant de moins de 5 pour cent  40 ans,  30 pour cent en 60 ans,  65 pour cent en 85 ans Au cours d'un pisode de diverticulite, une masse d'appel d'offres est palpable dans environ 20 pour cent et la distension abdominale est commune. Les symptmes comprennent la fivre, des douleurs abdominales (gnralement  gauche quadrant infrieur), des nauses et des vomissements, la constipation, la diarrhe et les symptmes urinaires. (Voir Les manifestations cliniques et le diagnostic de diverticulite aigu chez les adultes et appendicite aigu chez les adultes: Les manifestations cliniques et le diagnostic diffrentiel".)  INFORMATION POUR LES PATIENTS - UpToDate propose deux types de matriaux d'ducation des patients, "The Basics" et "Beyond the Basics." Les pices d'ducation des patients Basics sont crits dans un langage clair,  5 au 6 niveau de  lecture de [place], et ils rpondent  la [place] ou cinq questions cls un patient pourrait avoir sur une condition donne. Ces articles sont les meilleurs pour les patients qui veulent un aperu gnral et qui prfrent courts, des Neurosurgeon. Au-del du patient Basics morceaux d'ducation sont plus longs, plus sophistiqu et plus dtaill. Ces articles sont crits  la 10e au niveau de lecture de 12e anne et [employer] meilleurs pour les patients qui veulent des informations dtailles et sont  l'aise avec un certain jargon mdical.  Voici les articles d'ducation des patients qui sont pertinentes  ce sujet. Nous vous encourageons  imprimer ou par courriel ces sujets  vos patients. (Vous pouvez galement trouver des articles d'ducation des patients sur une varit de sujets en cherchant sur info patient et le mot-cl (s) d'intrt.)  ? Basics sujets (voir L'information des patients: Designer, fashion/clothing (Notions de base)")   ? Au-del des [employer] (voir L'information des patients: Radiographer, therapeutic (Beyond the Basics)")   SYNTHSE ET RECOMMANDATIONS  ? masses annexielles sont frquentes [employer] femmes de la naissance  la mnopause. L'tiologie est habituellement un processus primaire du tube de l'ovaire ou de [place], mais peut impliquer le ligament large, de l'utrus, de l'intestin, ou rtropritonale, ou d'une maladie mtastatique d'un autre site, [employer] sein ou de Chief Financial Officer. (Voir Introduction ci-dessus.)   ? La proccupation la plus srieuse quand une masse annexielle est dcouvert est la possibilit qu'elle est maligne. Les caractristiques qui augmentent la probabilit de Product manager incluent (voir risque de malignit ci-dessus):    Prpubre ou post-mnopausique femme    Une masse complexe ou solide apparaissant sur l'chographie    La prsence d'une femme connue pour avoir un cancer nongynecological (par exemple, du sein ou d'un cancer gastrique)    ascite   ? Le diagnostic diffrentiel d'une masse annexielle est large et varie selon l'ge et le statut mnopausique (tableau 2). (Voir Diagnostic diffrentiel ci-dessus.)   ? Les conclusions sur l'examen chographique [facility] l'ge et les rsultats cliniques aident  dterminer l'tiologie; Doctor, general practice, la chirurgie est souvent ncessaire pour le diagnostic et la thrapie. (Voir Diagnostic diffrentiel ci-dessus et "Approche du patient avec une masse annexielle".)

## 2015-02-17 NOTE — Progress Notes (Signed)
Subjective:     Patient ID: Kirsten Edwards, female   DOB: [birth year], 49 y.o.   MRN: [id number]  HPI Pt reports that she is here to f/u on her repeat sono.  She was initially seen by ortho and an MRI showed a ovarian mass.  A sono confirmed the mass and looked like a suspected dermoid.  Pt reports that she does have pain on that side and she does want the mass removed. She is having no sx today.    Past Medical History  Diagnosis Date  . DVT (deep venous thrombosis) 9/13    left leg   Past Surgical History  Procedure Laterality Date  . Left leg  9/13     DVT   Current Outpatient Prescriptions on File Prior to Visit  Medication Sig Dispense Refill  . ASPIRIN PO Take 325 mg by mouth daily.      No current facility-administered medications on file prior to visit.   No Known Allergies   Review of Systems     Objective:   Physical Exam BP 158/91 mmHg  Pulse 72  Temp(Src) 98.1 F (36.7 C) (Oral)  Ht 5\' 3"  (1.6 m)  Wt 163 lb 6.4 oz (74.118 kg)  BMI 28.95 kg/m2  LMP 08/25/2012 Pt in NAD.  Exam deferred    01/13/2015 CLINICAL DATA: Intermittent bilateral pelvic pain x3 years, history of right adnexal mass  EXAM: TRANSABDOMINAL AND TRANSVAGINAL ULTRASOUND OF PELVIS  TECHNIQUE: Both transabdominal and transvaginal ultrasound examinations of the pelvis were performed. Transabdominal technique was performed for global imaging of the pelvis including uterus, ovaries, adnexal regions, and pelvic cul-de-sac. It was necessary to proceed with endovaginal exam following the transabdominal exam to visualize the left ovary.  COMPARISON: 05/18/2014  FINDINGS: Uterus  Measurements: 7.6 x 2.9 x 4.3 cm. No fibroids or other mass visualized.  Endometrium  Thickness: 5 mm. No focal abnormality visualized.  Right ovary  5.4 x 5.0 x 6.2 cm solid right adnexal mass with echogenic component, compatible with a right ovarian dermoid. Distinct right ovarian tissue  separate from the mass is not definitely visualized.  Left ovary  Measurements: 2.1 x 1.0 x 1.4 cm. Normal appearance/no adnexal mass.  Other findings  No free fluid.  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     Assessment:     right ovarian dermoid     Plan:    Pt to complete financial aid paperwork and then call the ofc to leave a msg with me to schedule her surgery.  Pt needs to see primary care doc for surg clearance prior to surgery due to h/o DVT on ASA  Patient desires surgical management with right oophoerctomy.  The risks of surgery were discussed in detail with the patient including but not limited to: bleeding which may require transfusion or reoperation; infection which may require prolonged hospitalization or re-hospitalization and antibiotic therapy; injury to bowel, bladder, ureters and major vessels or other surrounding organs; need for additional procedures including laparotomy; thromboembolic phenomenon, incisional problems and other postoperative or anesthesia complications.  Patient was told that the likelihood that her condition and symptoms will be treated effectively with this surgical management was very high; the postoperative expectations were also discussed in detail. The patient also understands the alternative treatment options which were discussed in full. All questions were answered.  She was told that she will be contacted by our surgical scheduler regarding the time and date of her surgery; routine preoperative instructions  of having nothing to eat or drink after midnight on the day prior to surgery and also coming to the hospital 1 1/2 hours prior to her time of surgery were also emphasized.  She was told she may be called for a preoperative appointment about a week prior to surgery and will be given further preoperative instructions at that visit. Printed patient education handouts about the procedure were given to the  patient to review at home.

## 2015-02-17 NOTE — Progress Notes (Signed)
[name] used for interpreter

## 2015-02-28 IMAGING — US US EXTREM LOW VENOUS*L*
1 series · 13 of 24 positions shown · non-contrast
Comparison: Most recent prior venous ultrasound 10/16/2012

CLINICAL DATA: 46-year-old female with a history of Bautista
syndrome and acute left lower extremity DVT now 6 months status
post venous lysis and stenting of her left iliac vein.

LEFT LOWER EXTREMITY VENOUS DUPLEX ULTRASOUND
TECHNIQUE: Gray-scale sonography with graded compression, as well
as color Doppler and duplex ultrasound were performed to evaluate
the deep venous system of the lower extremity from the level of the
common femoral vein through the popliteal and proximal calf veins.
Spectral Doppler was utilized to evaluate flow at rest and with
distal augmentation maneuvers.

[Series 1: us extrem low venous*left* · 13 of 30 slices shown]
[im 1/30]
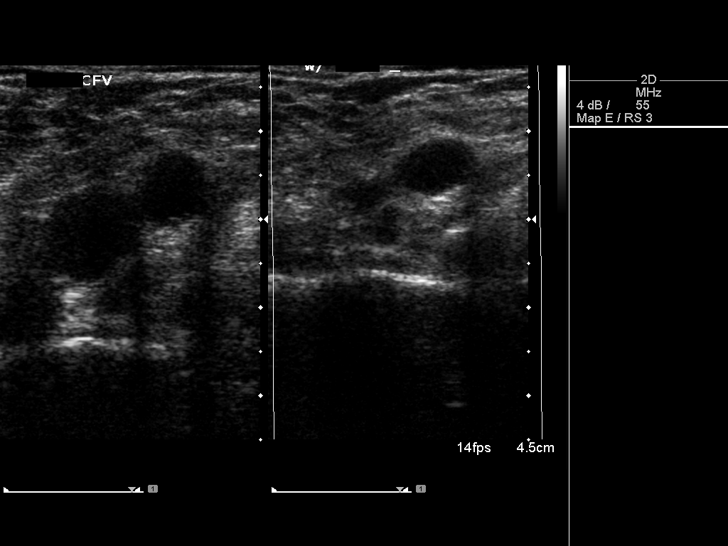
[im 3/30]
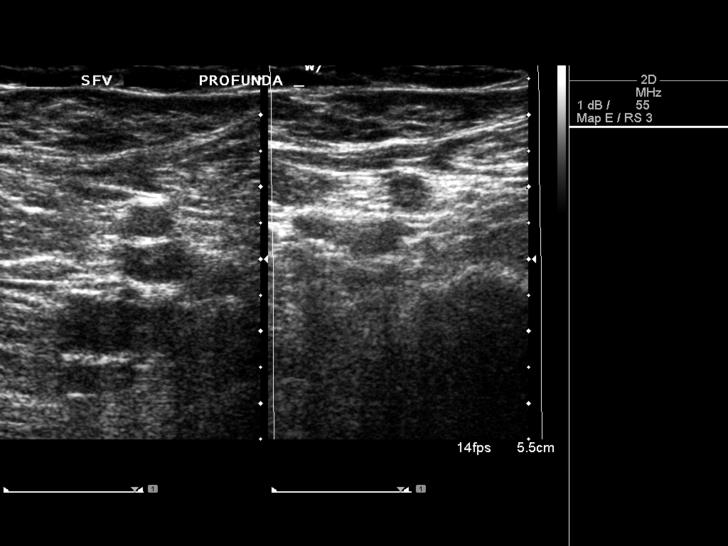
[im 6/30]
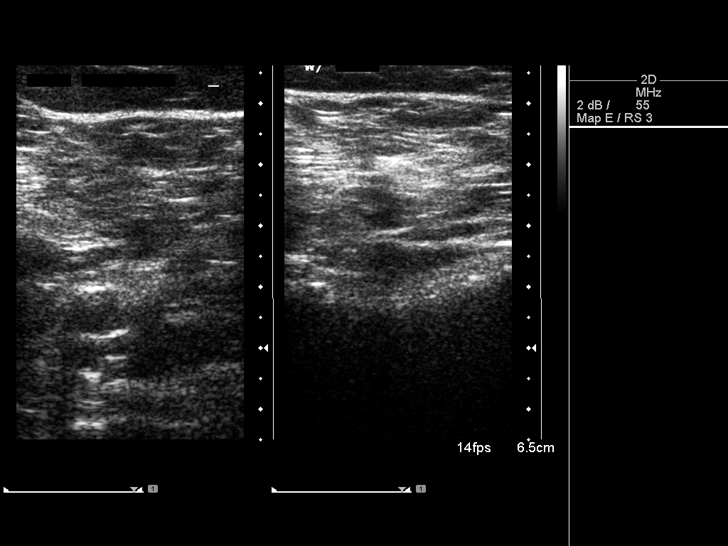
[im 8/30]
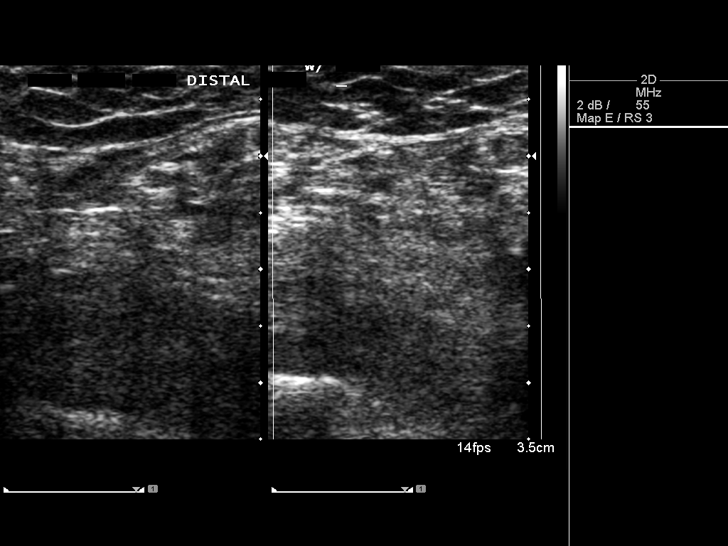
[im 11/30]
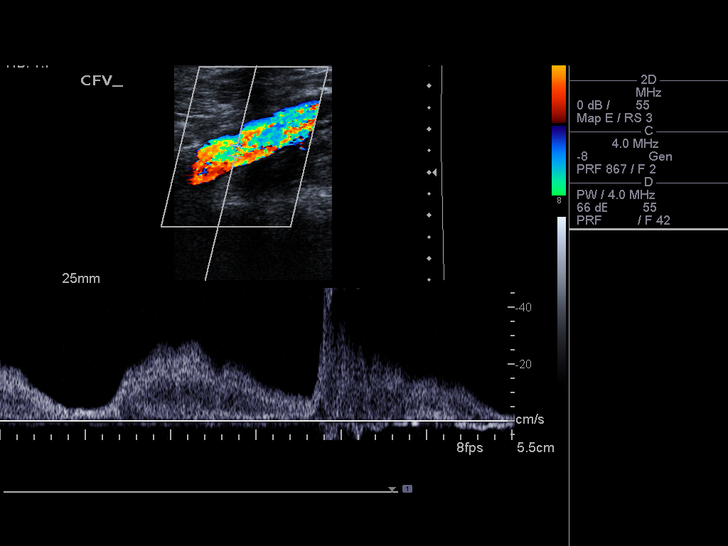
[im 13/30]
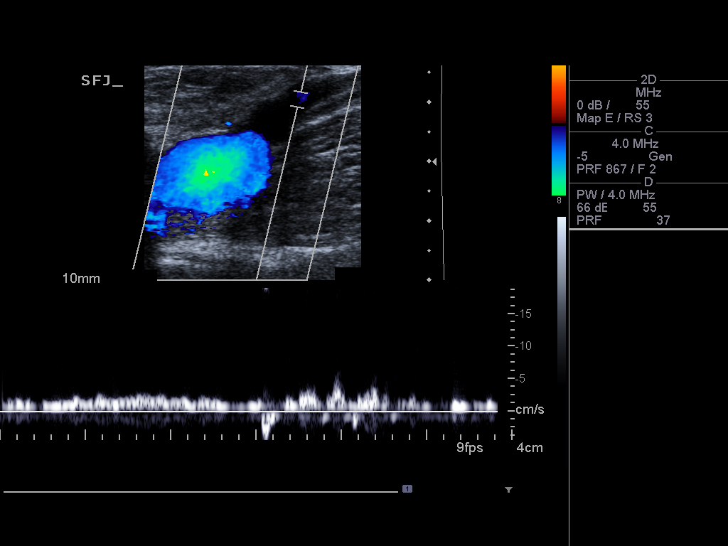
[im 16/30]
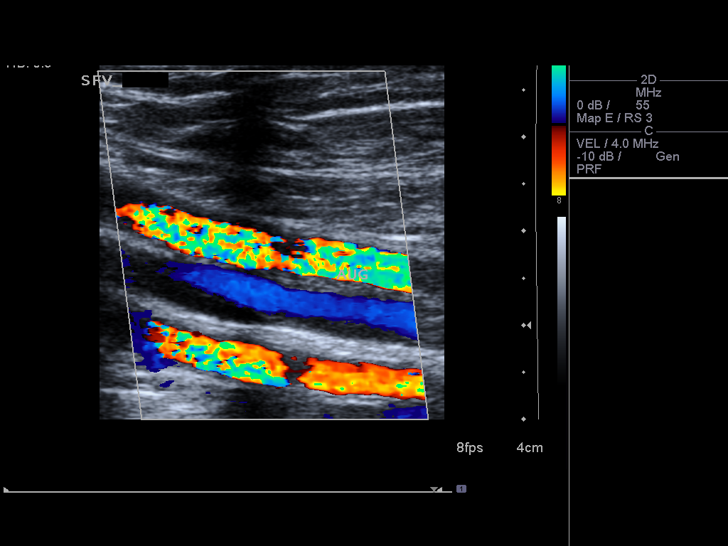
[im 17/30]
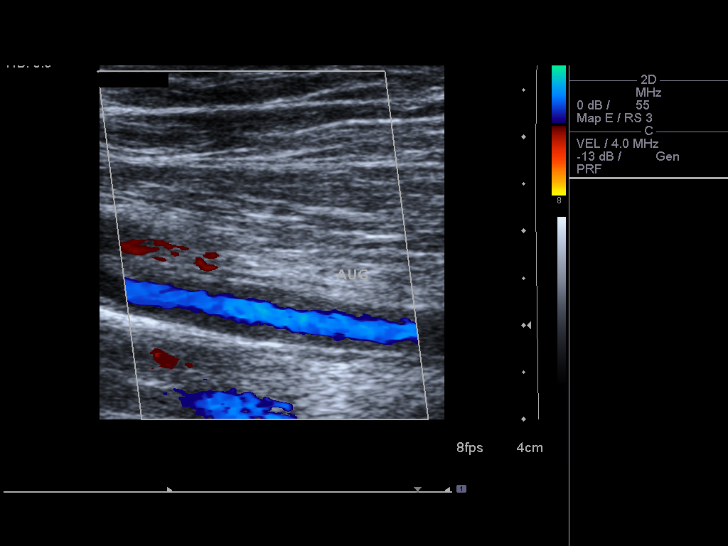
[im 19/30]
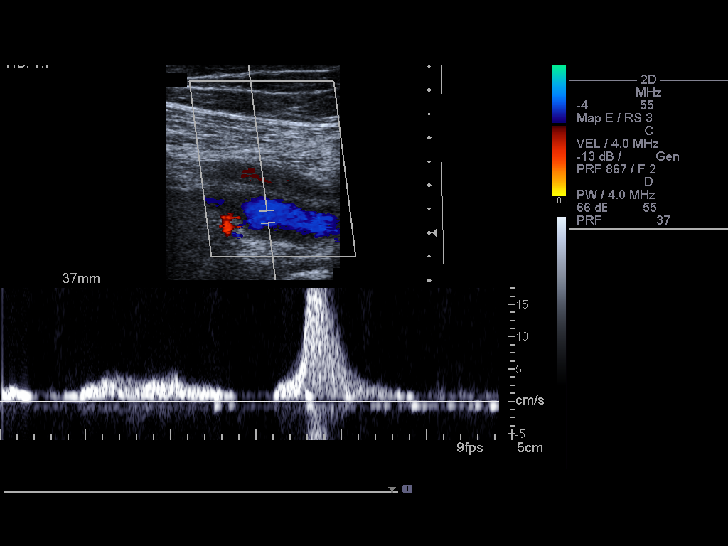
[im 22/30]
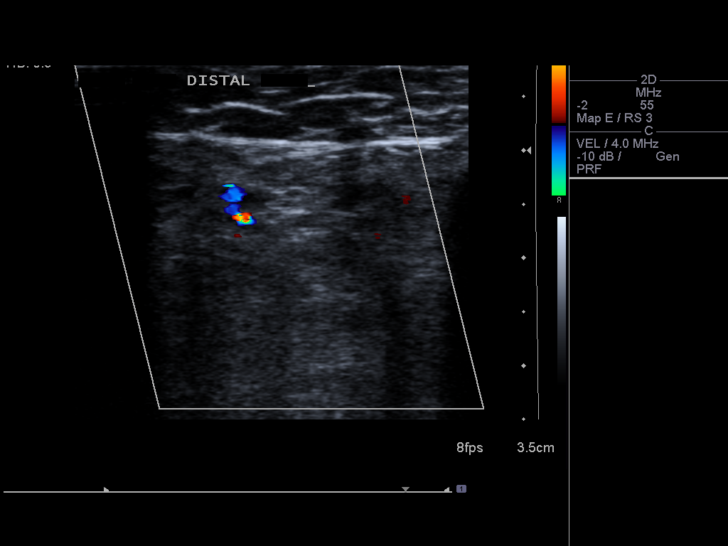
[im 24/30]
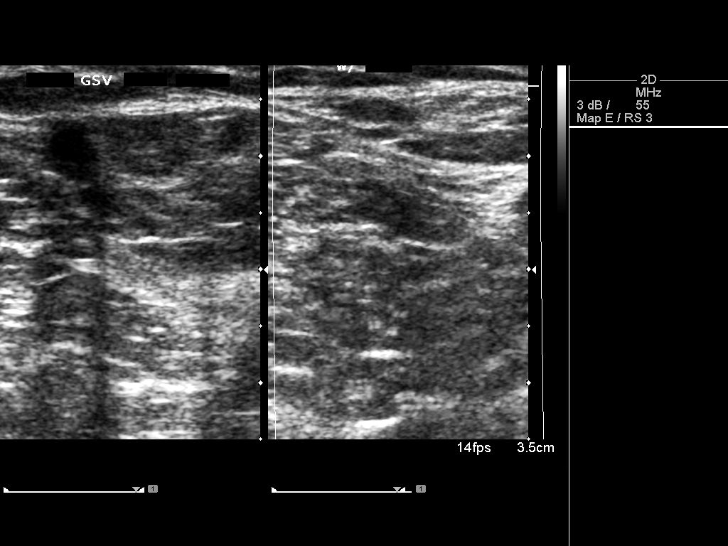
[im 27/30]
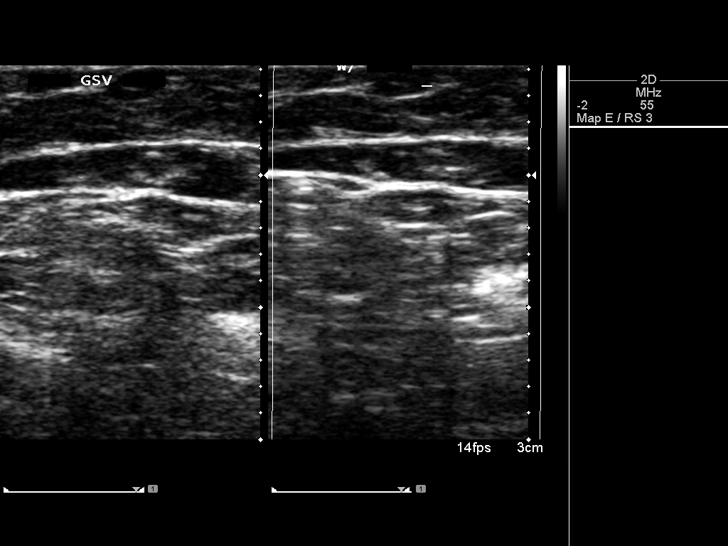
[im 30/30]
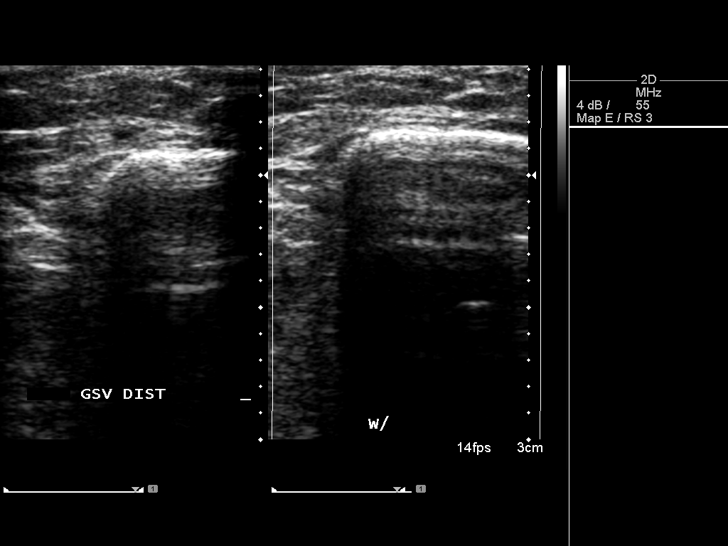

[13 of 24 positions shown; findings below may reference images not displayed]

FINDINGS: Normal compressibility of the common femoral,
superficial femoral, and popliteal veins is demonstrated, as well
as the visualized proximal calf veins.  No filling defects to
suggest DVT on grayscale or color Doppler imaging.  Doppler
waveforms show normal direction of venous flow, normal respiratory
phasicity and response to augmentation. There is some asymmetric
thickening of the vein wall in the proximal and, to a lesser
extent, mid femoral vein.
IMPRESSION: No evidence of lower extremity deep vein thrombosis. Respiratory
phasicity in the common femoral vein suggests patency of the more
proximal iliac veins stent.

Chronic wall thickening in the proximal and mid femoral vein.

[REDACTED]

## 2015-12-06 ENCOUNTER — Ambulatory Visit: Payer: Self-pay | Attending: Family Medicine

## 2015-12-06 ENCOUNTER — Ambulatory Visit: Payer: Self-pay

## 2016-03-07 IMAGING — US US EXTREM LOW VENOUS*L*
1 series · 13 of 24 positions shown · non-contrast
Comparison: Prior duplex venous ultrasound 03/25/2013

CLINICAL DATA: History [DATE] Remsiukas syndrome and extensive left
lower extremity DVT



[Series 1: us extrem low venous*left* · 13 of 28 slices shown]
[im 1/28]
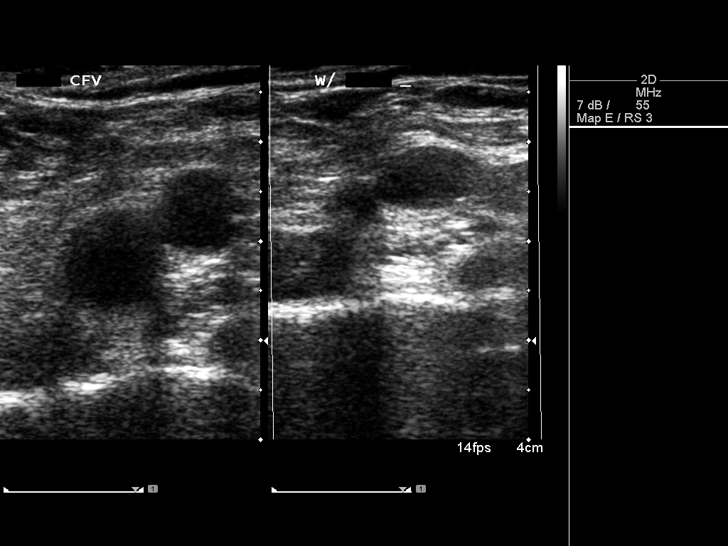
[im 3/28]
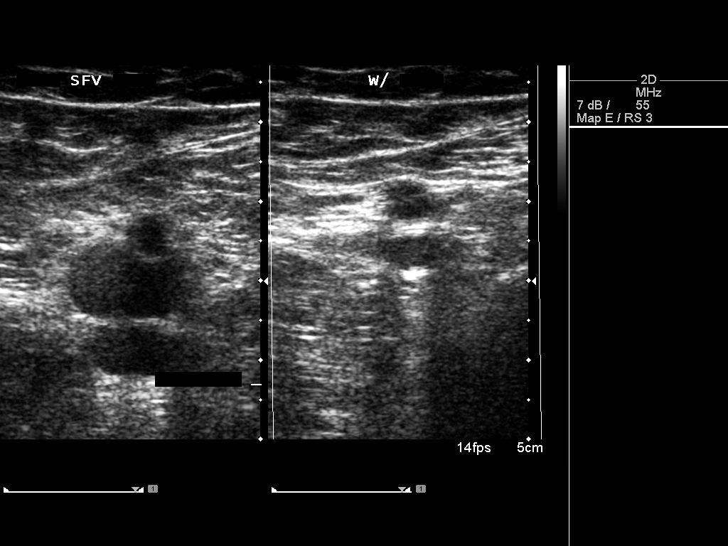
[im 5/28]
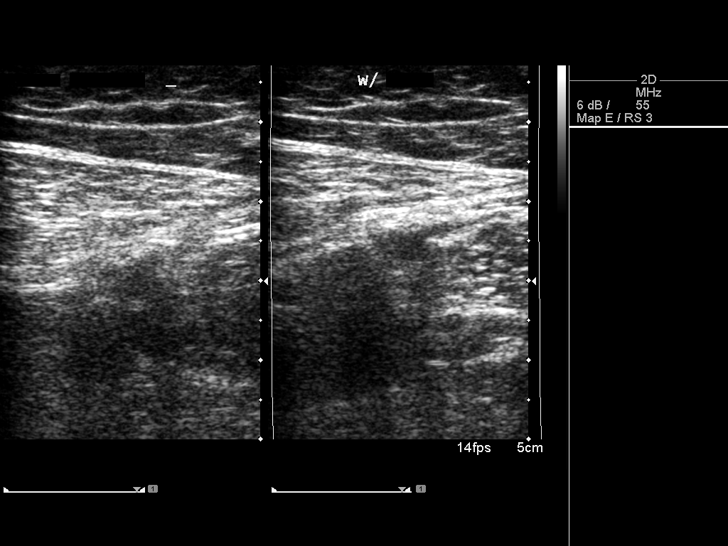
[im 8/28]
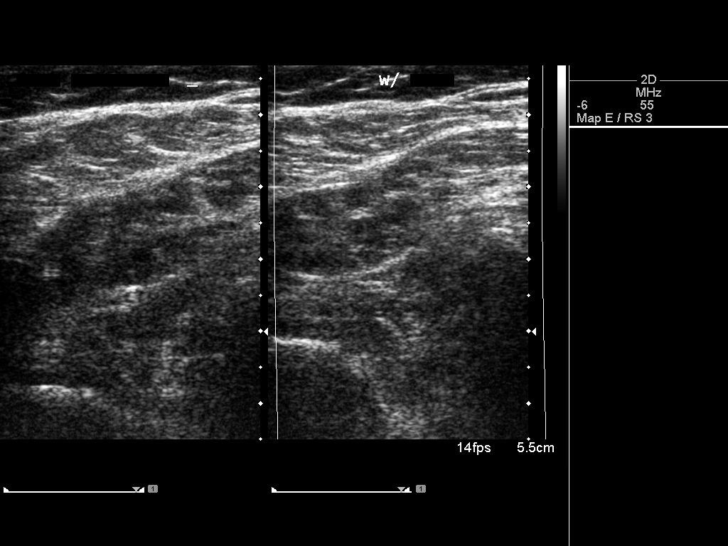
[im 10/28]
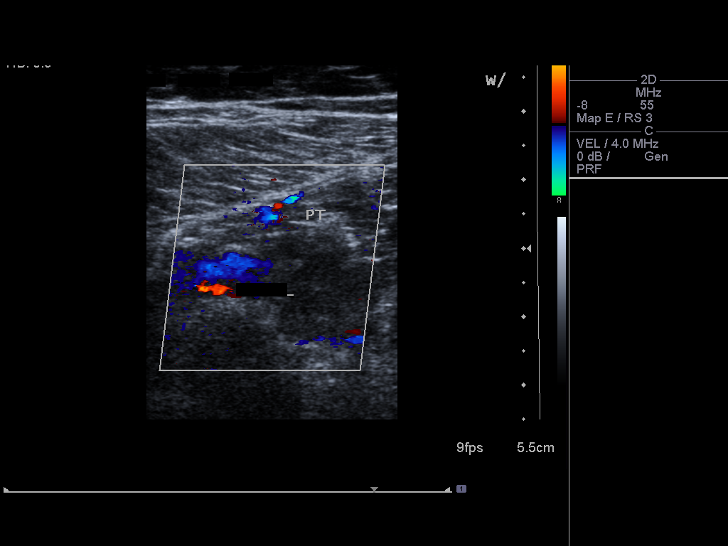
[im 12/28]
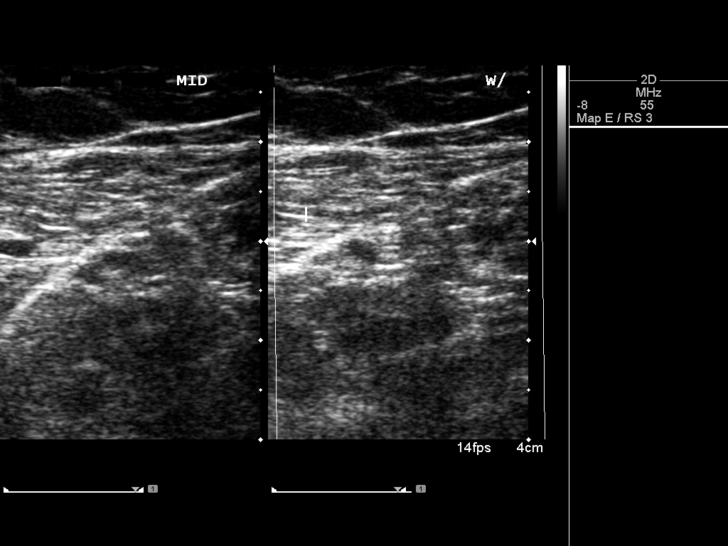
[im 15/28]
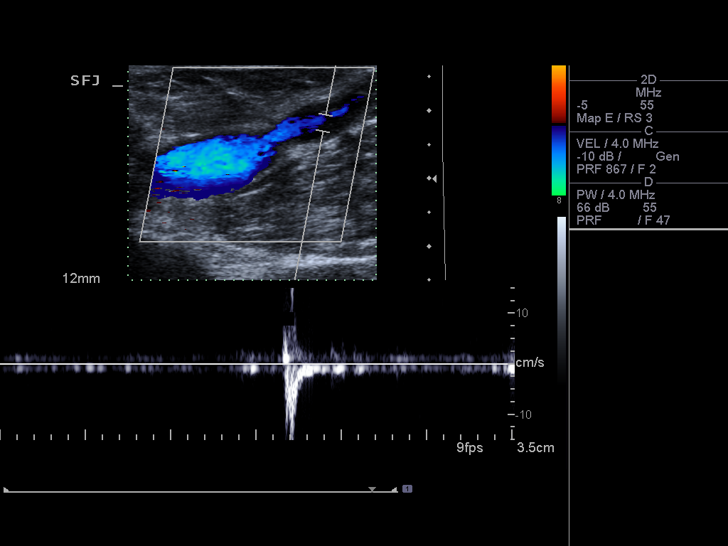
[im 16/28]
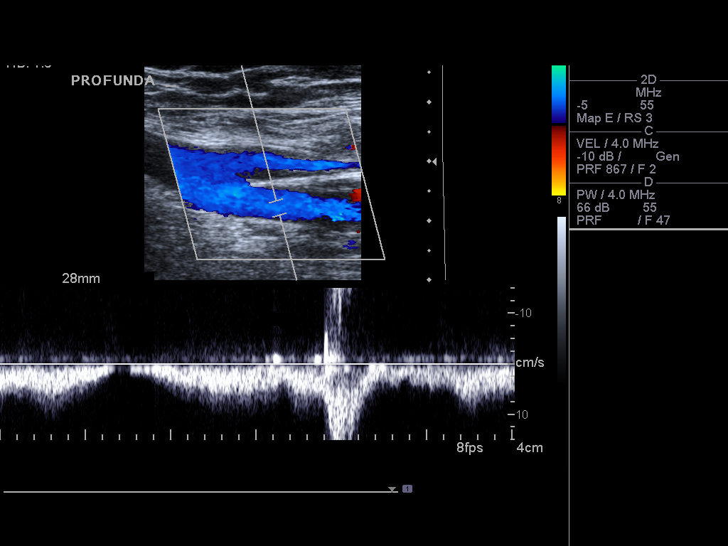
[im 18/28]
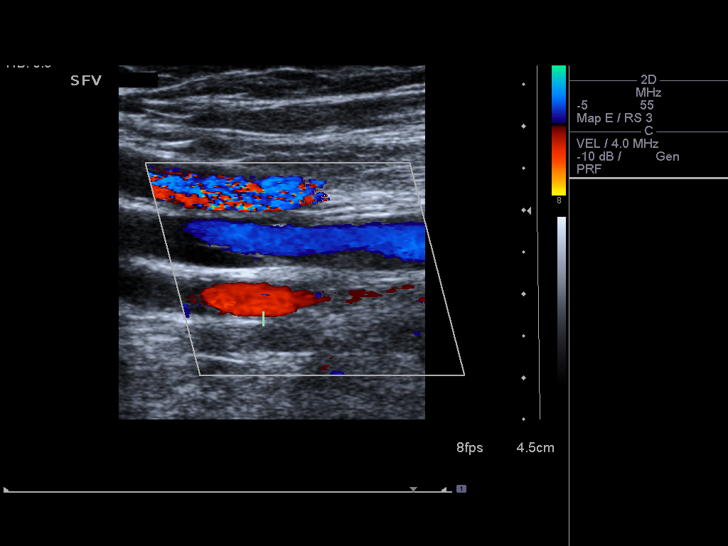
[im 20/28]
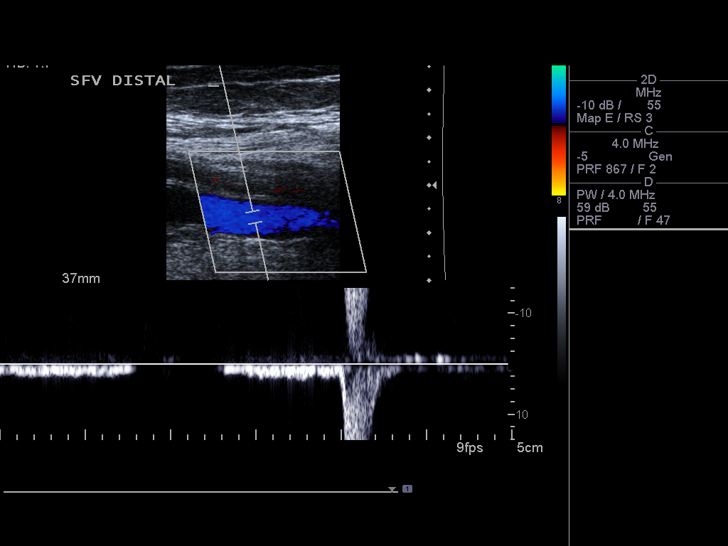
[im 23/28]
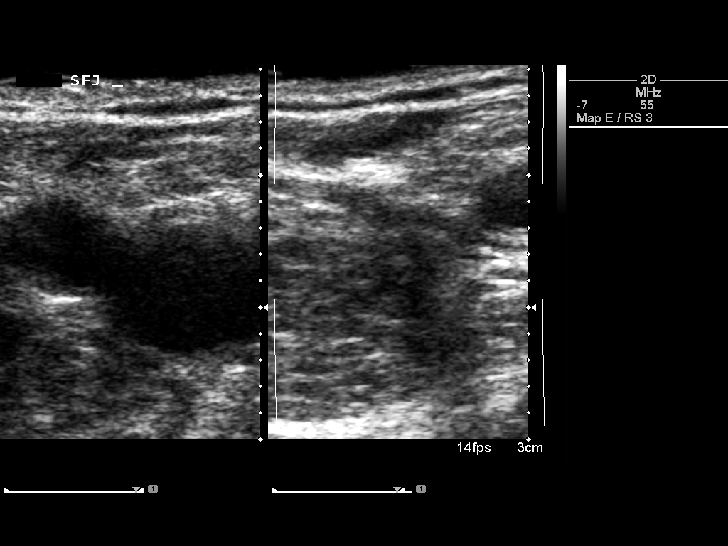
[im 25/28]
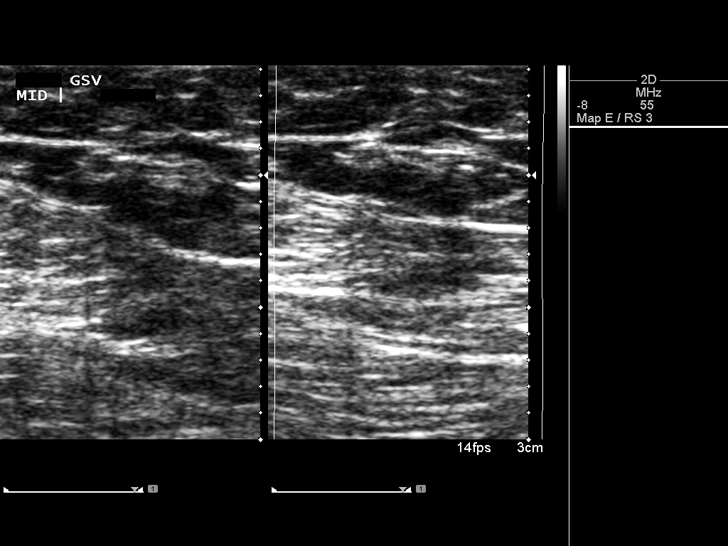
[im 28/28]
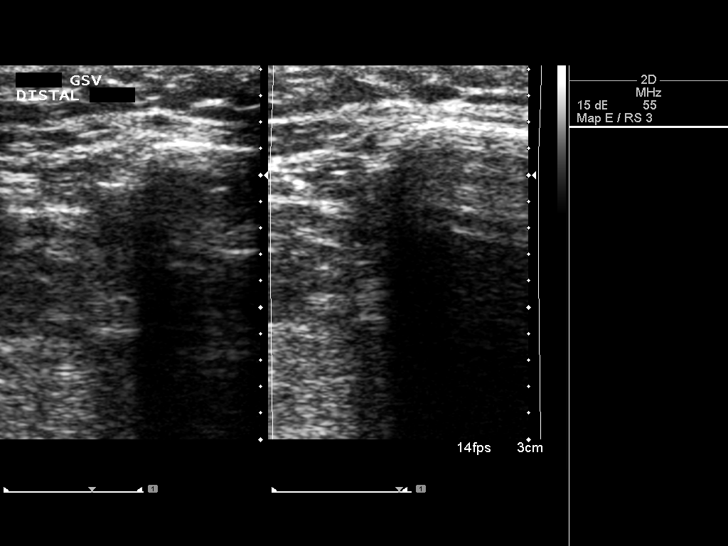

[13 of 24 positions shown; findings below may reference images not displayed]

FINDINGS: Common Femoral Vein: No evidence of thrombus. Normal
compressibility, respiratory phasicity and response to augmentation.

Saphenofemoral Junction: No evidence of thrombus. Normal
compressibility and flow on color Doppler imaging.

Profunda Femoral Vein: No evidence of thrombus. Normal
compressibility and flow on color Doppler imaging.

Femoral Vein: No evidence of thrombus. Unchanged chronic mild wall
thickening in the proximal and mid femoral vein. Normal
compressibility, respiratory phasicity and response to augmentation.

Popliteal Vein: No evidence of thrombus. Normal compressibility,
respiratory phasicity and response to augmentation.

Calf Veins: No evidence of thrombus. Normal compressibility and flow
on color Doppler imaging.

Superficial Great Saphenous Vein: No evidence of thrombus. Normal
compressibility and flow on color Doppler imaging.

Venous Reflux:  None.

Other Findings:  None.
IMPRESSION: Stable left lower extremity duplex venous evaluation.

No evidence of recurrent acute DVT.

Excellent respiratory phasicity in the left common femoral vein
suggests patency of the more proximal iliac venous stent.

Very mild chronic wall thickening in the proximal and mid femoral
vein is unchanged.

## 2016-04-04 ENCOUNTER — Other Ambulatory Visit: Payer: Self-pay

## 2016-04-04 ENCOUNTER — Ambulatory Visit: Payer: Self-pay | Attending: Internal Medicine | Admitting: Internal Medicine

## 2016-04-04 ENCOUNTER — Encounter: Payer: Self-pay | Admitting: Internal Medicine

## 2016-04-04 VITALS — BP 147/95 | HR 71 | Temp 97.8°F | Wt 162.2 lb

## 2016-04-04 DIAGNOSIS — I82522 Chronic embolism and thrombosis of left iliac vein: Secondary | ICD-10-CM

## 2016-04-04 DIAGNOSIS — D369 Benign neoplasm, unspecified site: Secondary | ICD-10-CM | POA: Insufficient documentation

## 2016-04-04 DIAGNOSIS — Z0000 Encounter for general adult medical examination without abnormal findings: Secondary | ICD-10-CM

## 2016-04-04 DIAGNOSIS — M79622 Pain in left upper arm: Secondary | ICD-10-CM | POA: Insufficient documentation

## 2016-04-04 DIAGNOSIS — R0789 Other chest pain: Secondary | ICD-10-CM

## 2016-04-04 DIAGNOSIS — R079 Chest pain, unspecified: Secondary | ICD-10-CM | POA: Insufficient documentation

## 2016-04-04 DIAGNOSIS — I10 Essential (primary) hypertension: Secondary | ICD-10-CM | POA: Insufficient documentation

## 2016-04-04 DIAGNOSIS — Z7982 Long term (current) use of aspirin: Secondary | ICD-10-CM | POA: Insufficient documentation

## 2016-04-04 DIAGNOSIS — R072 Precordial pain: Secondary | ICD-10-CM

## 2016-04-04 DIAGNOSIS — M5432 Sciatica, left side: Secondary | ICD-10-CM | POA: Insufficient documentation

## 2016-04-04 DIAGNOSIS — D270 Benign neoplasm of right ovary: Secondary | ICD-10-CM

## 2016-04-04 DIAGNOSIS — Z0001 Encounter for general adult medical examination with abnormal findings: Secondary | ICD-10-CM | POA: Insufficient documentation

## 2016-04-04 DIAGNOSIS — Z86718 Personal history of other venous thrombosis and embolism: Secondary | ICD-10-CM | POA: Insufficient documentation

## 2016-04-04 LAB — CBC WITH DIFFERENTIAL/PLATELET
BASOS ABS: 35 {cells}/uL (ref 0–200)
Basophils Relative: 1 %
EOS ABS: 35 {cells}/uL (ref 15–500)
Eosinophils Relative: 1 %
HCT: 39.7 % (ref 35.0–45.0)
HEMOGLOBIN: 12.6 g/dL (ref 11.7–15.5)
LYMPHS ABS: 1645 {cells}/uL (ref 850–3900)
Lymphocytes Relative: 47 %
MCH: 26.1 pg — AB (ref 27.0–33.0)
MCHC: 31.7 g/dL — AB (ref 32.0–36.0)
MCV: 82.4 fL (ref 80.0–100.0)
MPV: 9.9 fL (ref 7.5–12.5)
Monocytes Absolute: 315 cells/uL (ref 200–950)
Monocytes Relative: 9 %
NEUTROS ABS: 1470 {cells}/uL — AB (ref 1500–7800)
NEUTROS PCT: 42 %
Platelets: 231 10*3/uL (ref 140–400)
RBC: 4.82 MIL/uL (ref 3.80–5.10)
RDW: 14.9 % (ref 11.0–15.0)
WBC: 3.5 10*3/uL — ABNORMAL LOW (ref 3.8–10.8)

## 2016-04-04 LAB — LIPID PANEL
Cholesterol: 197 mg/dL (ref 125–200)
HDL: 72 mg/dL (ref >=46)
LDL CALC: 115 mg/dL (ref <130)
Total CHOL/HDL Ratio: 2.7 Ratio (ref <=5.0)
Triglycerides: 50 mg/dL (ref <150)
VLDL: 10 mg/dL (ref <30)

## 2016-04-04 LAB — BASIC METABOLIC PANEL
BUN: 13 mg/dL (ref 7–25)
CALCIUM: 9 mg/dL (ref 8.6–10.2)
CO2: 26 mmol/L (ref 20–31)
CREATININE: 0.82 mg/dL (ref 0.50–1.10)
Chloride: 105 mmol/L (ref 98–110)
GLUCOSE: 96 mg/dL (ref 65–99)
Potassium: 4.2 mmol/L (ref 3.5–5.3)
Sodium: 142 mmol/L (ref 135–146)

## 2016-04-04 LAB — TSH: TSH: 1.24 mIU/L

## 2016-04-04 MED ORDER — PANTOPRAZOLE SODIUM 40 MG PO TBEC: 40.0000 mg | DELAYED_RELEASE_TABLET | Freq: Every day | ORAL | Status: DC

## 2016-04-04 MED ORDER — HYDROCHLOROTHIAZIDE 25 MG PO TABS: 25.0000 mg | ORAL_TABLET | Freq: Every day | ORAL | Status: DC

## 2016-04-04 MED FILL — ?HYDROCHLOROTHIAZIDE 25 MG: 25 MG | 30 days supply | Qty: 30 | Fill #0

## 2016-04-04 MED FILL — ?PANTOPRAZOLE SOD DR 40MG: 40 MG | 30 days supply | Qty: 30 | Fill #0

## 2016-04-04 NOTE — Patient Instructions (Addendum)
Kirsten Edwards pharm 2 wks * Financial services  Hypertension Hypertension is another name for high blood pressure. High blood pressure forces your heart to work harder to pump blood. A blood pressure reading has two numbers, which includes a higher number over a lower number (example: 110/72). HOME CARE   Have your blood pressure rechecked by your doctor.  Only take medicine as told by your doctor. Follow the directions carefully. The medicine does not work as well if you skip doses. Skipping doses also puts you at risk for problems.  Do not smoke.  Monitor your blood pressure at home as told by your doctor. GET HELP IF:  You think you are having a reaction to the medicine you are taking.  You have repeat headaches or feel dizzy.  You have puffiness (swelling) in your ankles.  You have trouble with your vision. GET HELP RIGHT AWAY IF:   You get a very bad headache and are confused.  You feel weak, numb, or faint.  You get chest or belly (abdominal) pain.  You throw up (vomit).  You cannot breathe very well. MAKE SURE YOU:   Understand these instructions.  Will watch your condition.  Will get help right away if you are not doing well or get worse.   This information is not intended to replace advice given to you by your health care provider. Make sure you discuss any questions you have with your health care provider.   Document Released: 05/22/2008 Document Revised: 12/09/2013 Document Reviewed: 09/26/2013 Elsevier Interactive Patient Education 2016 [place] DASH stands for "Dietary Approaches to Stop Hypertension." The DASH eating plan is a healthy eating plan that has been shown to reduce high blood pressure (hypertension). Additional health benefits may include reducing the risk of type 2 diabetes mellitus, heart disease, and stroke. The DASH eating plan may also help with weight loss. WHAT DO I NEED TO KNOW ABOUT THE DASH EATING PLAN? For the  DASH eating plan, you will follow these general guidelines:  Choose foods with a percent daily value for sodium of less than 5% (as listed on the food label).  Use salt-free seasonings or herbs instead of table salt or sea salt.  Check with your health care provider or pharmacist before using salt substitutes.  Eat lower-sodium products, often labeled as "lower sodium" or "no salt added."  Eat fresh foods.  Eat more vegetables, fruits, and low-fat dairy products.  Choose whole grains. Look for the word "whole" as the first word in the ingredient list.  Choose fish and skinless chicken or [place] more often than red meat. Limit fish, poultry, and meat to 6 oz (170 g) each day.  Limit sweets, desserts, sugars, and sugary drinks.  Choose heart-healthy fats.  Limit cheese to 1 oz (28 g) per day.  Eat more home-cooked food and less restaurant, buffet, and fast food.  Limit fried foods.  Cook foods using methods other than frying.  Limit canned vegetables. If you do use them, rinse them well to decrease the sodium.  When eating at a restaurant, ask that your food be prepared with less salt, or no salt if possible. WHAT FOODS CAN I EAT? Seek help from a dietitian for individual calorie needs. Grains Whole grain or whole wheat bread. Brown rice. Whole grain or whole wheat pasta. Quinoa, bulgur, and whole grain cereals. Low-sodium cereals. Corn or whole wheat flour tortillas. Whole grain cornbread. Whole grain crackers. Low-sodium crackers. Vegetables Fresh or frozen  vegetables (raw, steamed, roasted, or grilled). Low-sodium or reduced-sodium tomato and vegetable juices. Low-sodium or reduced-sodium tomato sauce and paste. Low-sodium or reduced-sodium canned vegetables.  Fruits All fresh, canned (in natural juice), or frozen fruits. Meat and Other Protein Products Ground beef (85% or leaner), grass-fed beef, or beef trimmed of fat. Skinless chicken or [place]. Ground chicken or [place].  Pork trimmed of fat. All fish and seafood. Eggs. Dried beans, peas, or lentils. Unsalted nuts and seeds. Unsalted canned beans. Dairy Low-fat dairy products, such as skim or 1% milk, 2% or reduced-fat cheeses, low-fat ricotta or cottage cheese, or plain low-fat yogurt. Low-sodium or reduced-sodium cheeses. Fats and Oils Tub margarines without trans fats. Light or reduced-fat mayonnaise and salad dressings (reduced sodium). Avocado. Safflower, olive, or canola oils. Natural peanut or almond butter. Other Unsalted popcorn and pretzels. The items listed above may not be a complete list of recommended foods or beverages. Contact your dietitian for more options. WHAT FOODS ARE NOT RECOMMENDED? Grains White bread. White pasta. White rice. Refined cornbread. Bagels and croissants. Crackers that contain trans fat. Vegetables Creamed or fried vegetables. Vegetables in a cheese sauce. Regular canned vegetables. Regular canned tomato sauce and paste. Regular tomato and vegetable juices. Fruits Dried fruits. Canned fruit in light or heavy syrup. Fruit juice. Meat and Other Protein Products Fatty cuts of meat. Ribs, chicken wings, bacon, sausage, bologna, salami, chitterlings, fatback, hot dogs, bratwurst, and packaged luncheon meats. Salted nuts and seeds. Canned beans with salt. Dairy Whole or 2% milk, cream, half-and-half, and cream cheese. Whole-fat or sweetened yogurt. Full-fat cheeses or blue cheese. Nondairy creamers and whipped toppings. Processed cheese, cheese spreads, or cheese curds. Condiments Onion and garlic salt, seasoned salt, table salt, and sea salt. Canned and packaged gravies. Worcestershire sauce. Tartar sauce. Barbecue sauce. Teriyaki sauce. Soy sauce, including reduced sodium. Steak sauce. Fish sauce. Oyster sauce. Cocktail sauce. Horseradish. Ketchup and mustard. Meat flavorings and tenderizers. Bouillon cubes. Hot sauce. Tabasco sauce. Marinades. Taco seasonings. Relishes. Fats and  Oils Butter, stick margarine, lard, shortening, ghee, and bacon fat. Coconut, palm kernel, or palm oils. Regular salad dressings. Other Pickles and olives. Salted popcorn and pretzels. The items listed above may not be a complete list of foods and beverages to avoid. Contact your dietitian for more information. WHERE CAN I FIND MORE INFORMATION? National Heart, Lung, and Blood Institute: travelstabloid.com   This information is not intended to replace advice given to you by your health care provider. Make sure you discuss any questions you have with your health care provider.   Document Released: 11/23/2011 Document Revised: 12/25/2014 Document Reviewed: 10/08/2013 Elsevier Interactive Patient Education 2016 [place].  Back Exercises If you have pain in your back, do these exercises 2-3 times each day or as told by your doctor. When the pain goes away, do the exercises once each day, but repeat the steps more times for each exercise (do more repetitions). If you do not have pain in your back, do these exercises once each day or as told by your doctor. EXERCISES Single Knee to Chest Do these steps 3-5 times in a row for each leg:  Lie on your back on a firm bed or the floor with your legs stretched out.  Bring one knee to your chest.  Hold your knee to your chest by grabbing your knee or thigh.  Pull on your knee until you feel a gentle stretch in your lower back.  Keep doing the stretch for 10-30 seconds.  Slowly  let go of your leg and straighten it. Pelvic Tilt Do these steps 5-10 times in a row:  Lie on your back on a firm bed or the floor with your legs stretched out.  Bend your knees so they point up to the ceiling. Your feet should be flat on the floor.  Tighten your lower belly (abdomen) muscles to press your lower back against the floor. This will make your tailbone point up to the ceiling instead of pointing down to your feet or the  floor.  Stay in this position for 5-10 seconds while you gently tighten your muscles and breathe evenly. Cat-Cow Do these steps until your lower back bends more easily:  Get on your hands and knees on a firm surface. Keep your hands under your shoulders, and keep your knees under your hips. You may put padding under your knees.  Let your head hang down, and make your tailbone point down to the floor so your lower back is round like the back of a cat.  Stay in this position for 5 seconds.  Slowly lift your head and make your tailbone point up to the ceiling so your back hangs low (sags) like the back of a cow.  Stay in this position for 5 seconds. Press-Ups Do these steps 5-10 times in a row:  Lie on your belly (face-down) on the floor.  Place your hands near your head, about shoulder-width apart.  While you keep your back relaxed and keep your hips on the floor, slowly straighten your arms to raise the top half of your body and lift your shoulders. Do not use your back muscles. To make yourself more comfortable, you may change where you place your hands.  Stay in this position for 5 seconds.  Slowly return to lying flat on the floor. Bridges Do these steps 10 times in a row: 1. Lie on your back on a firm surface. 2. Bend your knees so they point up to the ceiling. Your feet should be flat on the floor. 3. Tighten your butt muscles and lift your butt off of the floor until your waist is almost as high as your knees. If you do not feel the muscles working in your butt and the back of your thighs, slide your feet 1-2 inches farther away from your butt. 4. Stay in this position for 3-5 seconds. 5. Slowly lower your butt to the floor, and let your butt muscles relax. If this exercise is too easy, try doing it with your arms crossed over your chest. Belly Crunches Do these steps 5-10 times in a row: 1. Lie on your back on a firm bed or the floor with your legs stretched out. 2. Bend  your knees so they point up to the ceiling. Your feet should be flat on the floor. 3. Cross your arms over your chest. 4. Tip your chin a little bit toward your chest but do not bend your neck. 5. Tighten your belly muscles and slowly raise your chest just enough to lift your shoulder blades a tiny bit off of the floor. 6. Slowly lower your chest and your head to the floor. Back Lifts Do these steps 5-10 times in a row: 1. Lie on your belly (face-down) with your arms at your sides, and rest your forehead on the floor. 2. Tighten the muscles in your legs and your butt. 3. Slowly lift your chest off of the floor while you keep your hips on the floor. Keep the  back of your head in line with the curve in your back. Look at the floor while you do this. 4. Stay in this position for 3-5 seconds. 5. Slowly lower your chest and your face to the floor. GET HELP IF:  Your back pain gets a lot worse when you do an exercise.  Your back pain does not lessen 2 hours after you exercise. If you have any of these problems, stop doing the exercises. Do not do them again unless your doctor says it is okay. GET HELP RIGHT AWAY IF:  You have sudden, very bad back pain. If this happens, stop doing the exercises. Do not do them again unless your doctor says it is okay.   This information is not intended to replace advice given to you by your health care provider. Make sure you discuss any questions you have with your health care provider.   Document Released: 01/06/2011 Document Revised: 08/25/2015 Document Reviewed: 01/28/2015 Elsevier Interactive Patient Education 2016 [employer]  - Low-Sodium Eating Plan Sodium raises blood pressure and causes water to be held in the body. Getting less sodium from food will help lower your blood pressure, reduce any swelling, and protect your heart, liver, and kidneys. We get sodium by adding salt (sodium chloride) to food. Most of our sodium comes from canned, boxed,  and frozen foods. Restaurant foods, fast foods, and pizza are also very high in sodium. Even if you take medicine to lower your blood pressure or to reduce fluid in your body, getting less sodium from your food is important. WHAT IS MY PLAN? Most people should limit their sodium intake to 2,300 mg a day. Your health care provider recommends that you limit your sodium intake to __________ a day.  WHAT DO I NEED TO KNOW ABOUT THIS EATING PLAN? For the low-sodium eating plan, you will follow these general guidelines:  Choose foods with a % Daily Value for sodium of less than 5% (as listed on the food label).   Use salt-free seasonings or herbs instead of table salt or sea salt.   Check with your health care provider or pharmacist before using salt substitutes.   Eat fresh foods.  Eat more vegetables and fruits.  Limit canned vegetables. If you do use them, rinse them well to decrease the sodium.   Limit cheese to 1 oz (28 g) per day.   Eat lower-sodium products, often labeled as "lower sodium" or "no salt added."  Avoid foods that contain monosodium glutamate (MSG). MSG is sometimes added to [place] food and some canned foods.  Check food labels (Nutrition Facts labels) on foods to learn how much sodium is in one serving.  Eat more home-cooked food and less restaurant, buffet, and fast food.  When eating at a restaurant, ask that your food be prepared with less salt, or no salt if possible.  HOW DO I READ FOOD LABELS FOR SODIUM INFORMATION? The Nutrition Facts label lists the amount of sodium in one serving of the food. If you eat more than one serving, you must multiply the listed amount of sodium by the number of servings. Food labels may also identify foods as:  Sodium free--Less than 5 mg in a serving.  Very low sodium--35 mg or less in a serving.  Low sodium--140 mg or less in a serving.  Light in sodium--50% less sodium in a serving. For example, if a food that  usually has 300 mg of sodium is changed to become light in sodium,  it will have 150 mg of sodium.  Reduced sodium--25% less sodium in a serving. For example, if a food that usually has 400 mg of sodium is changed to reduced sodium, it will have 300 mg of sodium. WHAT FOODS CAN I EAT? Grains Low-sodium cereals, including oats, puffed wheat and rice, and shredded wheat cereals. Low-sodium crackers. Unsalted rice and pasta. Lower-sodium bread.  Vegetables Frozen or fresh vegetables. Low-sodium or reduced-sodium canned vegetables. Low-sodium or reduced-sodium tomato sauce and paste. Low-sodium or reduced-sodium tomato and vegetable juices.  Fruits Fresh, frozen, and canned fruit. Fruit juice.  Meat and Other Protein Products Low-sodium canned tuna and salmon. Fresh or frozen meat, poultry, seafood, and fish. Lamb. Unsalted nuts. Dried beans, peas, and lentils without added salt. Unsalted canned beans. Homemade soups without salt. Eggs.  Dairy Milk. Soy milk. Ricotta cheese. Low-sodium or reduced-sodium cheeses. Yogurt.  Condiments Fresh and dried herbs and spices. Salt-free seasonings. Onion and garlic powders. Low-sodium varieties of mustard and ketchup. Fresh or refrigerated horseradish. Lemon juice.  Fats and Oils Reduced-sodium salad dressings. Unsalted butter.  Other Unsalted popcorn and pretzels.  The items listed above may not be a complete list of recommended foods or beverages. Contact your dietitian for more options. WHAT FOODS ARE NOT RECOMMENDED? Grains Instant hot cereals. Bread stuffing, pancake, and biscuit mixes. Croutons. Seasoned rice or pasta mixes. Noodle soup cups. Boxed or frozen macaroni and cheese. Self-rising flour. Regular salted crackers. Vegetables Regular canned vegetables. Regular canned tomato sauce and paste. Regular tomato and vegetable juices. Frozen vegetables in sauces. Salted [place] fries. Olives. Angie Fava. Relishes. Sauerkraut. Salsa. Meat and  Other Protein Products Salted, canned, smoked, spiced, or pickled meats, seafood, or fish. Bacon, ham, sausage, hot dogs, corned beef, chipped beef, and packaged luncheon meats. Salt pork. Jerky. Pickled herring. Anchovies, regular canned tuna, and sardines. Salted nuts. Dairy Processed cheese and cheese spreads. Cheese curds. Blue cheese and cottage cheese. Buttermilk.  Condiments Onion and garlic salt, seasoned salt, table salt, and sea salt. Canned and packaged gravies. Worcestershire sauce. Tartar sauce. Barbecue sauce. Teriyaki sauce. Soy sauce, including reduced sodium. Steak sauce. Fish sauce. Oyster sauce. Cocktail sauce. Horseradish that you find on the shelf. Regular ketchup and mustard. Meat flavorings and tenderizers. Bouillon cubes. Hot sauce. Tabasco sauce. Marinades. Taco seasonings. Relishes. Fats and Oils Regular salad dressings. Salted butter. Margarine. Ghee. Bacon fat.  Other Potato and tortilla chips. Corn chips and puffs. Salted popcorn and pretzels. Canned or dried soups. Pizza. Frozen entrees and pot pies.  The items listed above may not be a complete list of foods and beverages to avoid. Contact your dietitian for more information.   This information is not intended to replace advice given to you by your health care provider. Make sure you discuss any questions you have with your health care provider.   Document Released: 05/26/2002 Document Revised: 12/25/2014 Document Reviewed: 10/08/2013 Elsevier Interactive Patient Education [employer].

## 2016-04-04 NOTE — Progress Notes (Signed)
Kirsten Edwards, is a 50 y.o. female  [id number]  [id number]  DOB - 29-Apr-1966  CC:  Chief Complaint  Patient presents with  . New Patient (Initial Visit)       HPI: Kirsten Edwards is a 50 y.o. female here today to establish medical care.  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  Was treated with Xarelto, and now on daily ASA 325mg  for life.    Of note, she was last seen by OBgyn 3/16 and recd right oophorectomy for right dermoid cyst. Was pending surgical clearance, but appears lost to followup? Per Pt's husband, they saw "a doctor" than told her that sometimes cyst goes away and may not need surgery.  Today, pt c/o of waking up last 3 days w. midsternal cp/burning in nature, eventually goes away. No radiation, not related to food later in day, no diaphoresis, no n/v.  +sob when having pain, but resolves.  Gets intermmittant left UE arm ache occasionally, but not related to CP.  No CP w/ exertion/walking/etc.  Also c/o of bilateral back pain, w/ radiation down to left leg. Use to get steroid injections in her back which helped.  Uncomfortable going to bed at night.  Sometimes +burning sensation bilat. Legs as well.  Denies heavy lifting, states hard to get comfortable to sleep sometime due to pain.  Husband is here w/ pt and translating [place] for [place] fluently.    No Known Allergies Past Medical History  Diagnosis Date  . DVT (deep venous thrombosis) ([place]) 9/13    left leg   Current Outpatient Prescriptions on File Prior to Visit  Medication Sig Dispense Refill  . ASPIRIN PO Take 325 mg by mouth daily.      No current facility-administered medications on file prior to visit.   No family history on file. Social History   Social History  . Marital Status: Married    Spouse Name: N/A  . Number of Children: N/A    . Years of Education: N/A   Occupational History  . Not on file.   Social History Main Topics  . Smoking status: Never Smoker   . Smokeless tobacco: Never Used     Comment: never used tobacco  . Alcohol Use: Yes     Comment: some  . Drug Use: No  . Sexual Activity: Yes    Birth Control/ Protection: None   Other Topics Concern  . Not on file   Social History Narrative    Review of Systems: Constitutional: Negative for fever, chills, diaphoresis, activity change, appetite change and fatigue. HENT: Negative for ear pain, nosebleeds, congestion, facial swelling, rhinorrhea, neck pain, neck stiffness and ear discharge.  Eyes: Negative for pain, discharge, redness, itching and visual disturbance. Respiratory: Negative for cough, choking, chest tightness, shortness of breath, wheezing and stridor.  Cardiovascular: Negative for palpitations and leg swelling. Gastrointestinal: Negative for abdominal distention. Genitourinary: Negative for dysuria, urgency, frequency, hematuria, flank pain, decreased urine volume, difficulty urinating and dyspareunia.  Musculoskeletal: Negative for joint swelling, arthralgia and gait problem. Neurological: Negative for dizziness, tremors, seizures, syncope, facial asymmetry, speech difficulty, weakness, light-headedness, numbness and headaches.  Hematological: Negative for adenopathy. Does not bruise/bleed easily. Psychiatric/Behavioral: Negative for hallucinations, behavioral problems, confusion, dysphoric mood, decreased concentration and agitation.    Objective:   Filed Vitals:   04/04/16 1025  BP: 147/95  Pulse: 71  Temp: 97.8 F (36.6 C)    Physical Exam: Constitutional: Patient appears well-developed and well-nourished. No distress. AAOx3 HENT: Normocephalic, atraumatic, External right and left ear normal. Oropharynx is clear and moist.  Eyes: Conjunctivae and EOM are normal. PERRL, no scleral icterus. Neck: Normal ROM. Neck supple. No  JVD.  CVS: RRR, S1/S2 +, no murmurs, no gallops, no carotid bruit.  Pulmonary: Effort and breath sounds normal, no stridor, rhonchi, wheezes, rales.  Abdominal: Soft. BS +, no distension, tenderness, rebound or guarding.  Musculoskeletal: Normal range of motion. No edema and no tenderness.   Mild ttp to left lower back w/ mild pain induced w/ flexion of left knee against resistance.   No pain induced on flexion of right knee. No pain in internal/external rotation both legs. LE: bilat/ no c/c/e, pulses 2+ bilateral, no palpable cords, nttp. Lymphadenopathy: No lymphadenopathy noted, cervical. Neuro: Alert. Normal reflexes, muscle tone coordination. No cranial nerve deficit grossly. Skin: Skin is warm and dry. No rash noted. Not diaphoretic. No erythema. No pallor. Psychiatric: Normal mood and affect. Behavior, judgment, thought content normal.  Lab Results  Component Value Date   WBC 3.4* 07/24/2014   HGB 13.5 07/24/2014   HCT 41.2 07/24/2014   MCV 82 07/24/2014   PLT 220 07/24/2014   Lab Results  Component Value Date   CREATININE 0.71 09/15/2012   BUN 5* 09/15/2012   NA 137 09/15/2012   K 3.6 09/15/2012   CL 106 09/15/2012   CO2 22 09/15/2012    No results found for: HGBA1C Lipid Panel  No results found for: CHOL, TRIG, HDL, CHOLHDL, VLDL, LDLCALC   01/13/15 transvag US FINDINGS: Uterus  Measurements: 7.6 x 2.9 x 4.3 cm. No fibroids or other mass visualized.  Endometrium  Thickness: 5 mm. No focal abnormality visualized.  Right ovary  5.4 x 5.0 x 6.2 cm solid right adnexal mass with echogenic component, compatible with a right ovarian dermoid. Distinct right ovarian tissue separate from the mass is not definitely visualized.  Left ovary  Measurements: 2.1 x 1.0 x 1.4 cm. Normal appearance/no adnexal mass.  Other findings  No free fluid.  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   04/04/16 ekg 64bpm, nsr, nad,  no STEMI noted.    Assessment and plan:   1. H/o of deep vein thrombosis (DVT) of iliac vein of left lower extremity (HCC) - no abnml edema le noted, continue ASA 325mg  qd for life  2. Back pain with left-sided sciatica, suspected - has benefit in past w/ steroid injection, will place referral again. - Ambulatory referral to Pain Clinic - back exercise info given for stretching exercises  3. Mid sternal chest pain x 3 days, atypical -suspect gerd,  - EKG 12-Lead - nsr, no acute stemi. - CBC with Differential - trial protonix - in no improvement, consider outpt stresstest.  4. Left upper arm pain - intermittant - does not appear related to chest pain - will follow, see #3  5. Essential hypertension - slight up today (pt currently denies any pain) - continue hctz 25 qd - fu w/ [facility] 2 wk for bp chk. - Basic Metabolic Panel - Lipid Panel - dash/low salt diet discussed, also discussed increase physical exercise.  6. Health care maintenance - TSH - Vitamin D, 25-hydroxy - pap smear due - dw pt /family, recd they make appt w/ me in 1-2 wks for Papsmear or have done w/ gyn.,, whichever they prefer.  7. Dermoid cyst  of right ovary  Noted [place] 12/2014, pending right oophorectomy, but either lost to f/u or declined surgery? Not certain. - Ambulatory referral to Gynecology for further eval/testing if needed   Return in about 3 months (around 07/04/2016).  Pap smear appt when they can.  The patient was given clear instructions to go to ER or return to medical center if symptoms don't improve, worsen or new problems develop. The patient verbalized understanding. The patient was told to call to get lab results if they haven't heard anything in the next week.      Maren Reamer, MD, [facility], [place]   04/04/2016, 12:13 PM

## 2016-04-05 ENCOUNTER — Other Ambulatory Visit: Payer: Self-pay | Admitting: Internal Medicine

## 2016-04-05 LAB — VITAMIN D 25 HYDROXY (VIT D DEFICIENCY, FRACTURES): Vit D, 25-Hydroxy: 25 ng/mL — ABNORMAL LOW (ref 30–100)

## 2016-04-05 MED ORDER — VITAMIN D (ERGOCALCIFEROL) 1.25 MG (50000 UNIT) PO CAPS: 50000.0000 [IU] | ORAL_CAPSULE | ORAL | Status: DC

## 2016-04-22 IMAGING — US US TRANSVAGINAL NON-OB
1 series · 13 of 25 positions shown · non-contrast
Comparison: MR lumbar spine 04/29/2014.

CLINICAL DATA: Abnormal MR with cystic mass anterior to the uterus.

EXAM:
TRANSABDOMINAL AND TRANSVAGINAL ULTRASOUND OF PELVIS
TECHNIQUE: Both transabdominal and transvaginal ultrasound examinations of the
pelvis were performed. Transabdominal technique was performed for
global imaging of the pelvis including uterus, ovaries, adnexal
regions, and pelvic cul-de-sac. It was necessary to proceed with
endovaginal exam following the transabdominal exam to visualize the
uterus, endometrium, ovaries and adnexal regions..

[Series 1: us transvaginal non-ob · 0.22mm/px · 13 of 76 slices shown]
[im 1/76]
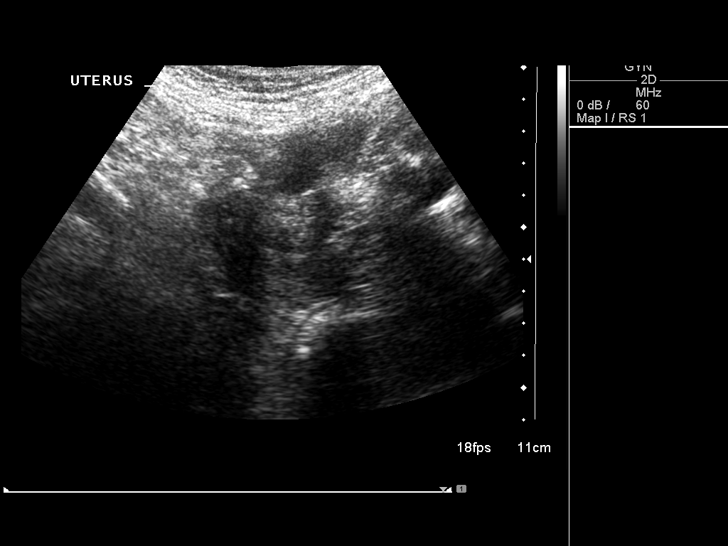
[im 7/76]
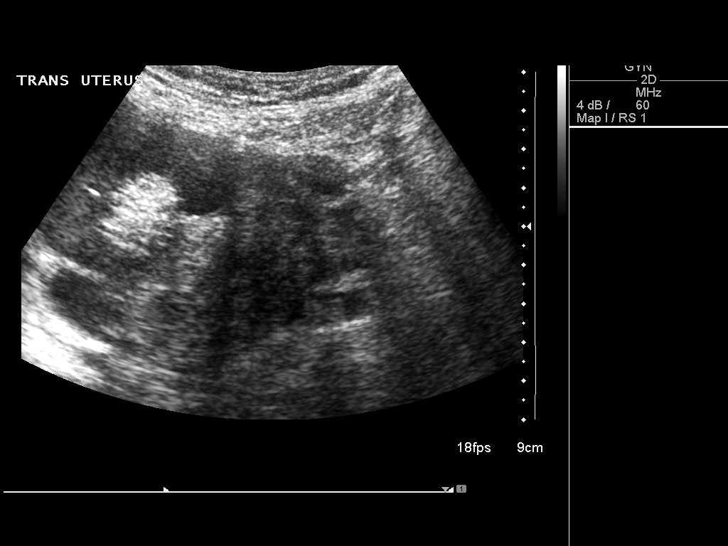
[im 13/76]
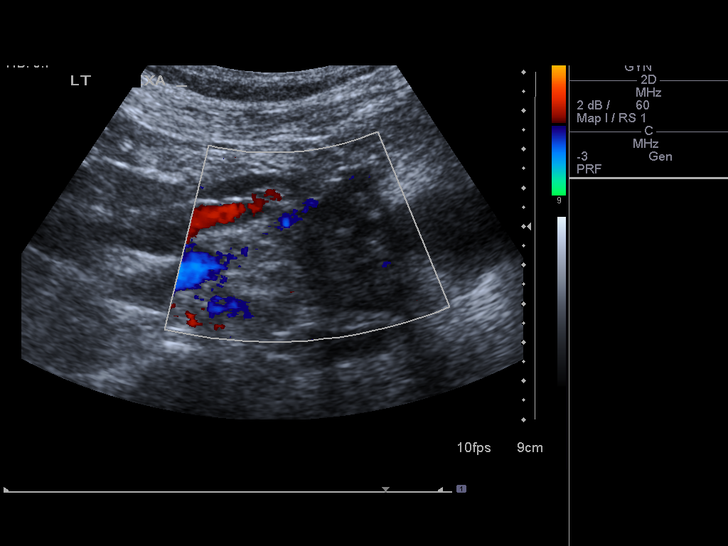
[im 19/76]
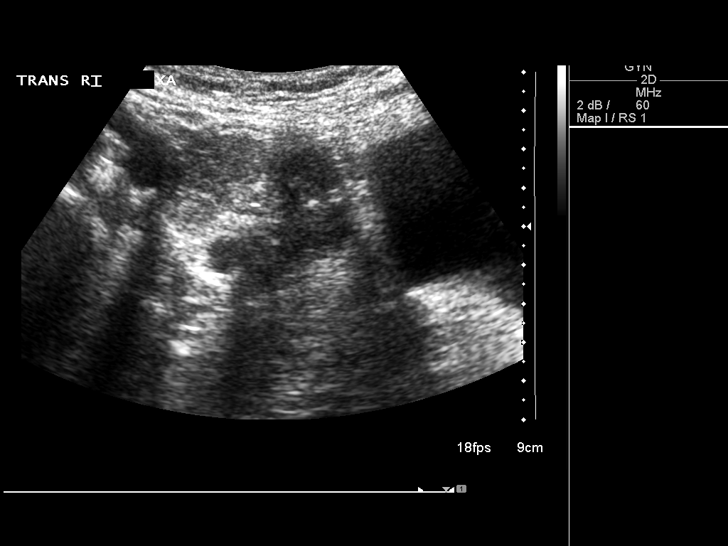
[im 26/76]
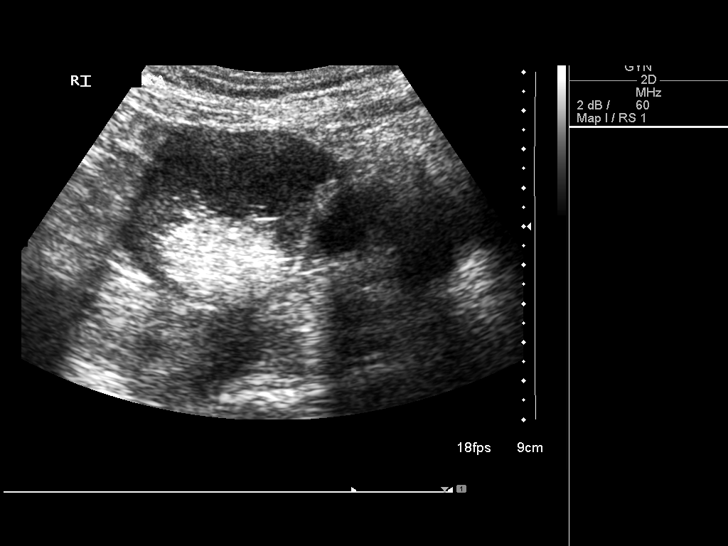
[im 32/76]
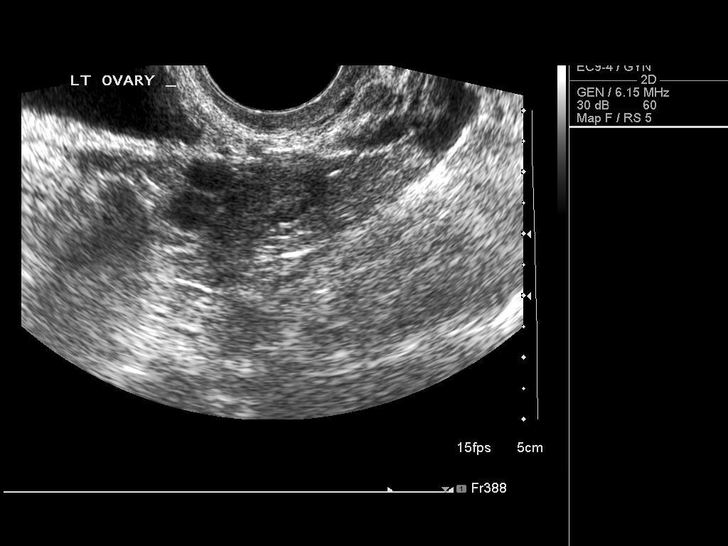
[im 38/76]
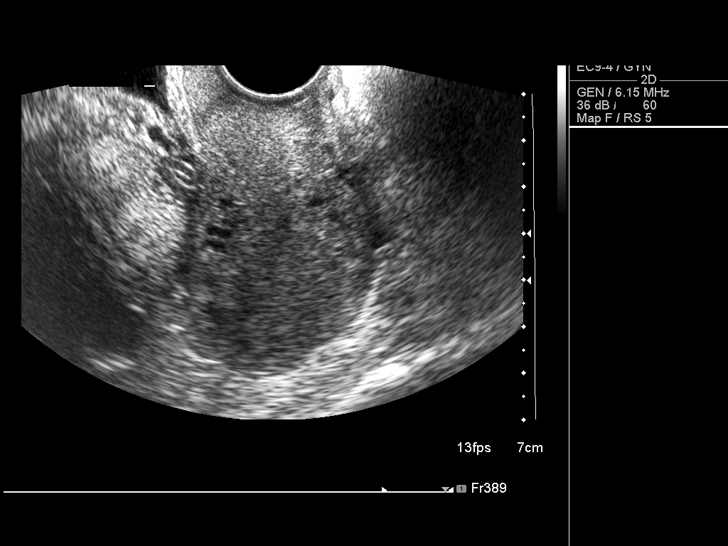
[im 44/76]
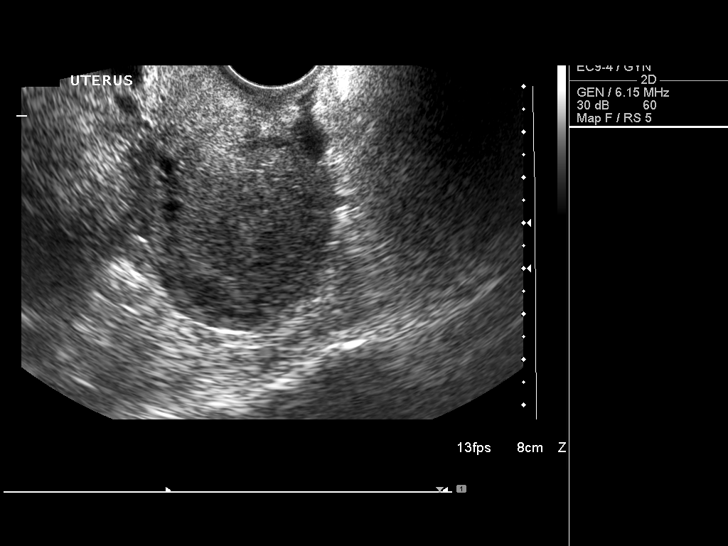
[im 51/76]
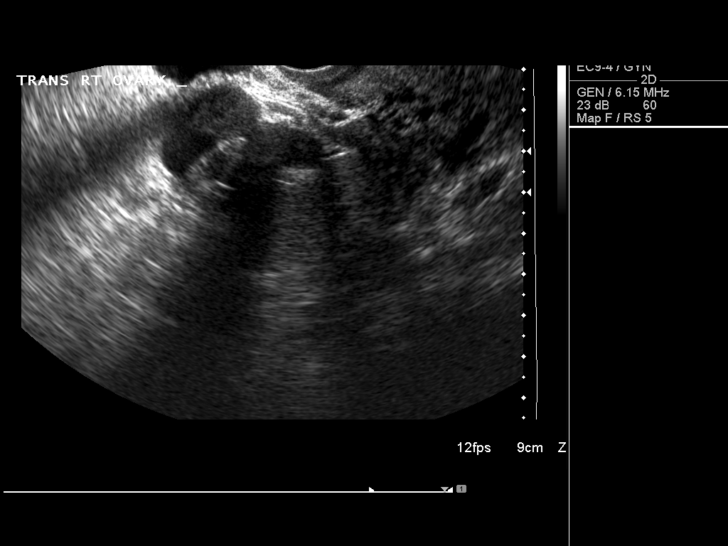
[im 57/76]
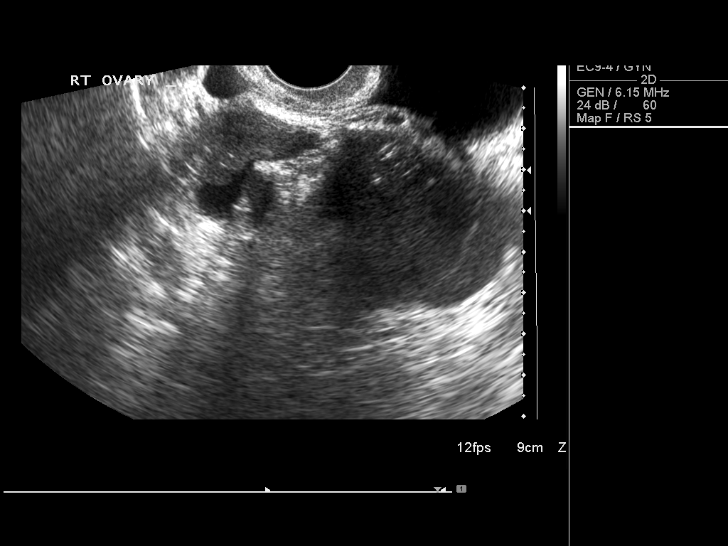
[im 63/76]
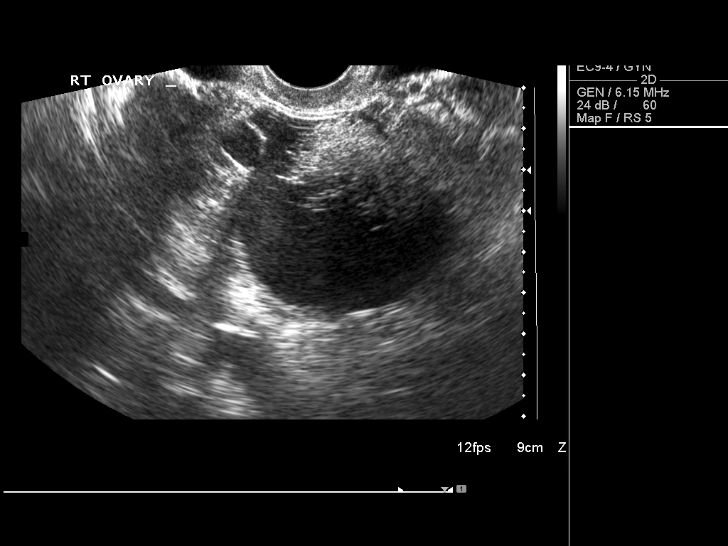
[im 69/76]
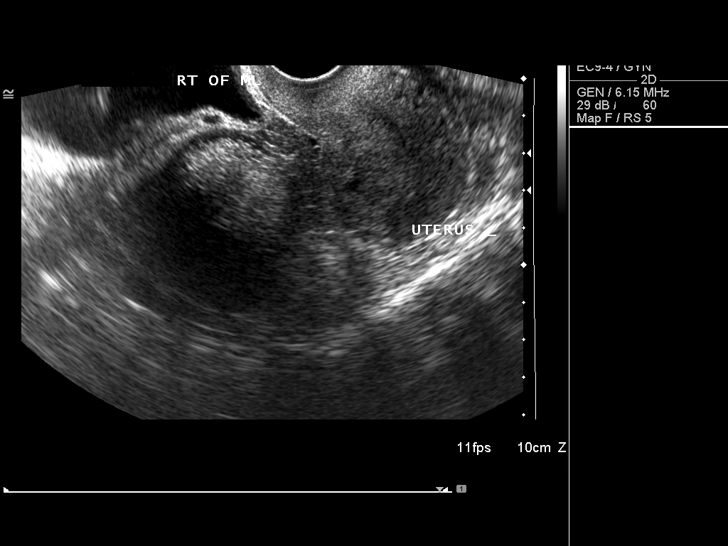
[im 76/76]
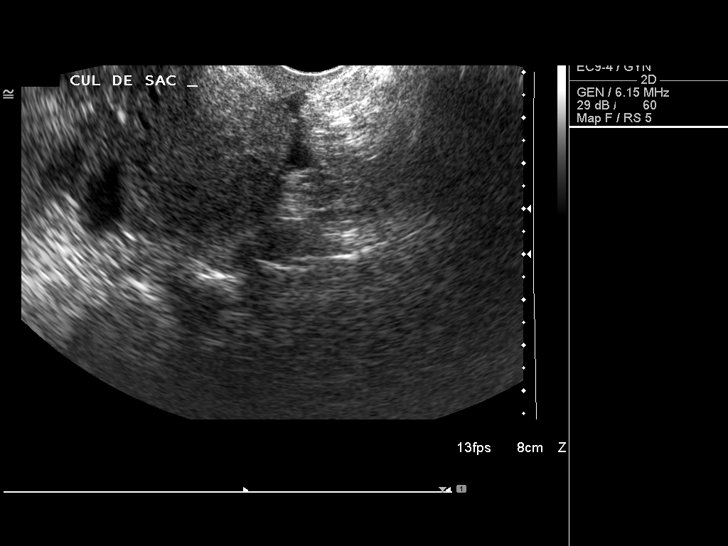

[13 of 25 positions shown; findings below may reference images not displayed]

FINDINGS: Uterus

Measurements: 6.2 x 4.0 x 5.1 cm. Mildly heterogeneous but no
discrete fibroid.

Endometrium

Thickness: 6 mm.  No focal abnormality visualized.

Right ovary

Measurements: A complex cystic in echogenic structure occupies the
right ovary, measuring 7.7 x 5.4 x 6.2 cm. Normal ovarian tissue is
not readily identified.

Left ovary

Measurements: 2.5 x 1.3 x 2.5 cm. Normal appearance/no adnexal mass.

Other findings

Trace free fluid.
IMPRESSION: Complex mass in the right adnexa without a separately seen right
ovary. Imaging characteristics suggest a dermoid. Internal fat could
be confirmed with CT abdomen pelvis, preferably with IV contrast, as
clinically indicated. Cystic ovarian neoplasm cannot be definitively
excluded.

## 2016-04-26 ENCOUNTER — Ambulatory Visit: Payer: Self-pay | Admitting: Obstetrics & Gynecology

## 2016-04-26 ENCOUNTER — Telehealth: Payer: Self-pay | Admitting: *Deleted

## 2016-04-26 NOTE — Telephone Encounter (Signed)
-----   Message from Maren Reamer, MD sent at 04/05/2016 10:33 AM EDT ----- Please call pt for lab results.  Vit D low (can cause bone/muscle pains that she c/o of) so prescribed Vit D. Take weekly and we will rechk in about 40months.   Kidneys normal, cholesterol panel within normal levels, keep up w/ low fat foods and increase exercise/walking.  Thyroid normal  No signs of anemia on wkup today.  thanks

## 2016-05-08 MED FILL — ?HYDROCHLOROTHIAZIDE 25 MG: 25 MG | 30 days supply | Qty: 30 | Fill #1

## 2016-05-08 MED FILL — ?PANTOPRAZOLE SOD DR 40MG: 40 MG | 30 days supply | Qty: 30 | Fill #1

## 2016-06-29 MED FILL — HYDROCHLOROTHIAZIDE 25 MG T: 25 | 30 days supply | Qty: 30 | Fill #2

## 2016-06-29 MED FILL — ?PANTOPRAZOLE SOD DR 40MG: 40 MG | 30 days supply | Qty: 30 | Fill #2

## 2016-07-11 ENCOUNTER — Encounter: Payer: Self-pay | Admitting: Internal Medicine

## 2016-07-11 ENCOUNTER — Ambulatory Visit: Payer: Self-pay | Attending: Internal Medicine | Admitting: Internal Medicine

## 2016-07-11 VITALS — BP 129/93 | HR 102 | Temp 98.6°F | Resp 16 | Ht 65.0 in

## 2016-07-11 DIAGNOSIS — Z86718 Personal history of other venous thrombosis and embolism: Secondary | ICD-10-CM | POA: Insufficient documentation

## 2016-07-11 DIAGNOSIS — I10 Essential (primary) hypertension: Secondary | ICD-10-CM | POA: Insufficient documentation

## 2016-07-11 DIAGNOSIS — Z114 Encounter for screening for human immunodeficiency virus [HIV]: Secondary | ICD-10-CM | POA: Insufficient documentation

## 2016-07-11 DIAGNOSIS — K219 Gastro-esophageal reflux disease without esophagitis: Secondary | ICD-10-CM | POA: Insufficient documentation

## 2016-07-11 DIAGNOSIS — Z23 Encounter for immunization: Secondary | ICD-10-CM

## 2016-07-11 DIAGNOSIS — R079 Chest pain, unspecified: Secondary | ICD-10-CM | POA: Insufficient documentation

## 2016-07-11 DIAGNOSIS — R252 Cramp and spasm: Secondary | ICD-10-CM | POA: Insufficient documentation

## 2016-07-11 DIAGNOSIS — M7989 Other specified soft tissue disorders: Secondary | ICD-10-CM | POA: Insufficient documentation

## 2016-07-11 DIAGNOSIS — Z7982 Long term (current) use of aspirin: Secondary | ICD-10-CM | POA: Insufficient documentation

## 2016-07-11 DIAGNOSIS — D369 Benign neoplasm, unspecified site: Secondary | ICD-10-CM | POA: Insufficient documentation

## 2016-07-11 DIAGNOSIS — Z79899 Other long term (current) drug therapy: Secondary | ICD-10-CM | POA: Insufficient documentation

## 2016-07-11 DIAGNOSIS — Z789 Other specified health status: Secondary | ICD-10-CM | POA: Insufficient documentation

## 2016-07-11 LAB — BASIC METABOLIC PANEL WITH GFR
BUN: 12 mg/dL (ref 7–25)
CHLORIDE: 103 mmol/L (ref 98–110)
CO2: 28 mmol/L (ref 20–31)
Calcium: 9.6 mg/dL (ref 8.6–10.2)
Creat: 0.94 mg/dL (ref 0.50–1.10)
GFR, EST AFRICAN AMERICAN: 83 mL/min (ref >=60)
GFR, EST NON AFRICAN AMERICAN: 72 mL/min (ref >=60)
GLUCOSE: 106 mg/dL — AB (ref 65–99)
POTASSIUM: 3.3 mmol/L — AB (ref 3.5–5.3)
Sodium: 142 mmol/L (ref 135–146)

## 2016-07-11 LAB — MAGNESIUM: Magnesium: 1.9 mg/dL (ref 1.5–2.5)

## 2016-07-11 MED ORDER — HYDROCHLOROTHIAZIDE 25 MG PO TABS: 25.0000 mg | ORAL_TABLET | Freq: Every day | ORAL | 3 refills | Status: DC

## 2016-07-11 MED ORDER — PANTOPRAZOLE SODIUM 40 MG PO TBEC
40.0000 mg | DELAYED_RELEASE_TABLET | Freq: Every day | ORAL | 3 refills | Status: DC
Start: 2016-07-11 — End: 2016-10-23

## 2016-07-11 NOTE — Progress Notes (Signed)
Kirsten Edwards, is a 50 y.o. female  [id number]  [id number]  DOB - May 28, 1966  Chief Complaint  Patient presents with  . Follow-up        Subjective:   Kirsten Edwards is a 50 y.o. female here today for a follow up visit, last seen on 04/04/16, here w. Husband.  Per pt, when she initially started there PPI and hctz, she noted transient swelling of her right hand, but It resolved in 1-2 hrs and never recurred.  She initially told my CMA that she is currently not taking any meds, but upon further questioning w/ the interpreter's assistance, she states she is taking the ppi and hctz.  She never started the vid d supplements.   Of note, she states her MEG pain /chest pain is gone since she started the PPI.  Denies cp/meg pain currently.  She does c/o of left leg cramping/muscle like pains that have been going on for the past 2 wks or so, so painful it wakes her up from sleep. Currently not taking anything.  Currently no pain today.  She has not heard from [facility] yet for the dermoid cyst.   Patient has No headache, No chest pain, No abdominal pain - No Nausea, No new weakness tingling or numbness, No Cough - SOB.  Pt's husband is here w/ her today.  [place] Interpreter was used to communicate directly with patient for the entire encounter including providing detailed patient instructions. , using ipad.  Problem  Htn (Hypertension), Benign  Mid Sternal Chest Pain (Resolved)    ALLERGIES: No Known Allergies  PAST MEDICAL HISTORY: Past Medical History:  Diagnosis Date  . DVT (deep venous thrombosis) ([place]) 9/13   left leg    MEDICATIONS AT HOME: Prior to Admission medications   Medication Sig Start Date End Date Taking? Authorizing Provider  ASPIRIN PO Take 325 mg by mouth daily.    Yes Historical Provider, MD  hydrochlorothiazide (HYDRODIURIL) 25 MG tablet Take 1 tablet (25 mg total) by mouth daily. 07/11/16   Maren Reamer, MD  pantoprazole (PROTONIX) 40 MG tablet  Take 1 tablet (40 mg total) by mouth daily. 07/11/16   Maren Reamer, MD  Vitamin D, Ergocalciferol, (DRISDOL) 50000 units CAPS capsule Take 1 capsule (50,000 Units total) by mouth every 7 (seven) days. Patient not taking: Reported on 07/11/2016 04/05/16   Maren Reamer, MD     Objective:   Vitals:   07/11/16 1132  BP: (!) 129/93  Pulse: (!) 102  Resp: 16  Temp: 98.6 F (37 C)  TempSrc: Oral  SpO2: 95%  Height: 5\' 5"  (1.651 m)    Exam General appearance : Awake, alert, not in any distress. Speech Clear. Not toxic looking HEENT: Atraumatic and Normocephalic, pupils equally reactive to light. Neck: supple, no JVD. Chest:Good air entry bilaterally, no added sounds. CVS: S1 S2 regular, no murmurs/gallups or rubs.  No MEG pain/no cp on ascultation. Abdomen: Bowel sounds active, Non tender and not distended with no gaurding, rigidity or rebound.  No palpable masses bilat le. No reproducible pain on exam. Extremities: B/L Lower Ext shows no edema, both legs are warm to touch, 2+ periph pulses bilat. Neurology: Awake alert, and oriented X 3, CN II-XII grossly intact, Non focal Skin:No Rash  Data Review No results found for: HGBA1C  Depression screen [facility] 2/9 07/11/2016  Decreased Interest 3  Down, Depressed, Hopeless 1  PHQ - 2 Score 4  Altered sleeping 2  Tired, decreased energy 2  Change in appetite 1  Feeling bad or failure about yourself  1  Trouble concentrating 0  Moving slowly or fidgety/restless 2  Suicidal thoughts 0  PHQ-9 Score 12      Assessment & Plan   1. HTN (hypertension), benign Slightly elevated, keep hctz 25 qday, Recd DASH/low salt diet, reviewed this w/ interpreter assitance  2. Muscle cramps suspected, may be due to  Electrolytes abnml from hctz - BASIC METABOLIC PANEL WITH GFR - Magnesium  3. GERD - MEG pain/chest pain resolved since started PPI - continue PPI recd  4. Encounter for screening for HIV - HIV antibody (with reflex)  5.  Health maintenance tdap today.  6. Dermoid cyst - sent letter to referral specialist to chk on status of this ob referral., in system looked like was approved, but pt /husband have not received appt.  7. language barrier affecting healthcare - despite having interpreter, I am uncertain if she is even taking the PPI or hctz.   Patient have been counseled extensively about nutrition and exercise  Return in about 3 months (around 10/11/2016), or if symptoms worsen or fail to improve.  The patient was given clear instructions to go to ER or return to medical center if symptoms don't improve, worsen or new problems develop. The patient verbalized understanding. The patient was told to call to get lab results if they haven't heard anything in the next week.   This note has been created with Surveyor, quantity. Any transcriptional errors are unintentional.   Maren Reamer, MD, [place] [facility], [place]   07/11/2016, 12:38 PM

## 2016-07-11 NOTE — Patient Instructions (Signed)
Low-Sodium Eating Plan Sodium raises blood pressure and causes water to be held in the body. Getting less sodium from food will help lower your blood pressure, reduce any swelling, and protect your heart, liver, and kidneys. We get sodium by adding salt (sodium chloride) to food. Most of our sodium comes from canned, boxed, and frozen foods. Restaurant foods, fast foods, and pizza are also very high in sodium. Even if you take medicine to lower your blood pressure or to reduce fluid in your body, getting less sodium from your food is important. WHAT IS MY PLAN? Most people should limit their sodium intake to 2,300 mg a day. Your health care provider recommends that you limit your sodium intake to 2 gram a day.  WHAT DO I NEED TO KNOW ABOUT THIS EATING PLAN? For the low-sodium eating plan, you will follow these general guidelines:  Choose foods with a % Daily Value for sodium of less than 5% (as listed on the food label).   Use salt-free seasonings or herbs instead of table salt or sea salt.   Check with your health care provider or pharmacist before using salt substitutes.   Eat fresh foods.  Eat more vegetables and fruits.  Limit canned vegetables. If you do use them, rinse them well to decrease the sodium.   Limit cheese to 1 oz (28 g) per day.   Eat lower-sodium products, often labeled as "lower sodium" or "no salt added."  Avoid foods that contain monosodium glutamate (MSG). MSG is sometimes added to [place] food and some canned foods.  Check food labels (Nutrition Facts labels) on foods to learn how much sodium is in one serving.  Eat more home-cooked food and less restaurant, buffet, and fast food.  When eating at a restaurant, ask that your food be prepared with less salt, or no salt if possible.  HOW DO I READ FOOD LABELS FOR SODIUM INFORMATION? The Nutrition Facts label lists the amount of sodium in one serving of the food. If you eat more than one serving, you must  multiply the listed amount of sodium by the number of servings. Food labels may also identify foods as:  Sodium free--Less than 5 mg in a serving.  Very low sodium--35 mg or less in a serving.  Low sodium--140 mg or less in a serving.  Light in sodium--50% less sodium in a serving. For example, if a food that usually has 300 mg of sodium is changed to become light in sodium, it will have 150 mg of sodium.  Reduced sodium--25% less sodium in a serving. For example, if a food that usually has 400 mg of sodium is changed to reduced sodium, it will have 300 mg of sodium. WHAT FOODS CAN I EAT? Grains Low-sodium cereals, including oats, puffed wheat and rice, and shredded wheat cereals. Low-sodium crackers. Unsalted rice and pasta. Lower-sodium bread.  Vegetables Frozen or fresh vegetables. Low-sodium or reduced-sodium canned vegetables. Low-sodium or reduced-sodium tomato sauce and paste. Low-sodium or reduced-sodium tomato and vegetable juices.  Fruits Fresh, frozen, and canned fruit. Fruit juice.  Meat and Other Protein Products Low-sodium canned tuna and salmon. Fresh or frozen meat, poultry, seafood, and fish. Lamb. Unsalted nuts. Dried beans, peas, and lentils without added salt. Unsalted canned beans. Homemade soups without salt. Eggs.  Dairy Milk. Soy milk. Ricotta cheese. Low-sodium or reduced-sodium cheeses. Yogurt.  Condiments Fresh and dried herbs and spices. Salt-free seasonings. Onion and garlic powders. Low-sodium varieties of mustard and ketchup. Fresh or refrigerated horseradish.  Lemon juice.  Fats and Oils Reduced-sodium salad dressings. Unsalted butter.  Other Unsalted popcorn and pretzels.  The items listed above may not be a complete list of recommended foods or beverages. Contact your dietitian for more options. WHAT FOODS ARE NOT RECOMMENDED? Grains Instant hot cereals. Bread stuffing, pancake, and biscuit mixes. Croutons. Seasoned rice or pasta mixes.  Noodle soup cups. Boxed or frozen macaroni and cheese. Self-rising flour. Regular salted crackers. Vegetables Regular canned vegetables. Regular canned tomato sauce and paste. Regular tomato and vegetable juices. Frozen vegetables in sauces. Salted [place] fries. Olives. Angie Fava. Relishes. Sauerkraut. Salsa. Meat and Other Protein Products Salted, canned, smoked, spiced, or pickled meats, seafood, or fish. Bacon, ham, sausage, hot dogs, corned beef, chipped beef, and packaged luncheon meats. Salt pork. Jerky. Pickled herring. Anchovies, regular canned tuna, and sardines. Salted nuts. Dairy Processed cheese and cheese spreads. Cheese curds. Blue cheese and cottage cheese. Buttermilk.  Condiments Onion and garlic salt, seasoned salt, table salt, and sea salt. Canned and packaged gravies. Worcestershire sauce. Tartar sauce. Barbecue sauce. Teriyaki sauce. Soy sauce, including reduced sodium. Steak sauce. Fish sauce. Oyster sauce. Cocktail sauce. Horseradish that you find on the shelf. Regular ketchup and mustard. Meat flavorings and tenderizers. Bouillon cubes. Hot sauce. Tabasco sauce. Marinades. Taco seasonings. Relishes. Fats and Oils Regular salad dressings. Salted butter. Margarine. Ghee. Bacon fat.  Other Potato and tortilla chips. Corn chips and puffs. Salted popcorn and pretzels. Canned or dried soups. Pizza. Frozen entrees and pot pies.  The items listed above may not be a complete list of foods and beverages to avoid. Contact your dietitian for more information.   This information is not intended to replace advice given to you by your health care provider. Make sure you discuss any questions you have with your health care provider.   Document Released: 05/26/2002 Document Revised: 12/25/2014 Document Reviewed: 10/08/2013 Elsevier Interactive Patient Education [employer].

## 2016-07-11 NOTE — Progress Notes (Signed)
Pt here for F/U. Pt reports pain in left foot rated at a 6 that has been present for 3 days.

## 2016-07-12 ENCOUNTER — Encounter: Payer: Self-pay | Admitting: Internal Medicine

## 2016-07-12 ENCOUNTER — Other Ambulatory Visit: Payer: Self-pay | Admitting: Internal Medicine

## 2016-07-12 LAB — HIV ANTIBODY (ROUTINE TESTING W REFLEX): HIV 1&2 Ab, 4th Generation: NONREACTIVE

## 2016-07-12 MED ORDER — POTASSIUM CHLORIDE ER 10 MEQ PO TBCR: 10.0000 meq | EXTENDED_RELEASE_TABLET | Freq: Every day | ORAL | 3 refills | Status: DC

## 2016-07-19 ENCOUNTER — Telehealth: Payer: Self-pay

## 2016-07-19 NOTE — Telephone Encounter (Signed)
[employer] Lennette Bihari [id number] Contacted pt to go over lab results pt is not avaiable at this time husband states he will let her know we called and she will return a call back to the office

## 2016-07-20 ENCOUNTER — Telehealth: Payer: Self-pay

## 2016-07-20 NOTE — Telephone Encounter (Signed)
[place] Id# [id number] Contacted pt tp go over lab results pt is not available will be mailing results out

## 2016-07-27 MED FILL — HYDROCHLOROTHIAZIDE 25 MG T: 25 | 30 days supply | Qty: 30 | Fill #0

## 2016-07-27 MED FILL — POTASSIUM CL 10 MEQ TAB SA: 10 | 30 days supply | Qty: 30 | Fill #0

## 2016-07-27 MED FILL — ?PANTOPRAZOLE SOD DR 40MG: 40 MG | 30 days supply | Qty: 30 | Fill #0

## 2016-08-29 MED FILL — POTASSIUM CL 10 MEQ TAB SA: 10 | 30 days supply | Qty: 30 | Fill #1

## 2016-09-01 MED FILL — HYDROCHLOROTHIAZIDE 25 MG T: 25 | 30 days supply | Qty: 30 | Fill #1

## 2016-09-01 MED FILL — ?PANTOPRAZOLE SOD DR 40MG: 40 MG | 30 days supply | Qty: 30 | Fill #1

## 2016-09-29 MED FILL — ?PANTOPRAZOLE SOD DR 40MG: 40 MG | 30 days supply | Qty: 30 | Fill #2

## 2016-09-29 MED FILL — HYDROCHLOROTHIAZIDE 25 MG T: 25 | 30 days supply | Qty: 30 | Fill #2

## 2016-09-29 MED FILL — POTASSIUM CL 10 MEQ TAB SA: 10 | 30 days supply | Qty: 30 | Fill #2

## 2016-10-23 ENCOUNTER — Encounter: Payer: Self-pay | Admitting: Internal Medicine

## 2016-10-23 ENCOUNTER — Ambulatory Visit: Payer: Self-pay | Attending: Internal Medicine | Admitting: Internal Medicine

## 2016-10-23 VITALS — BP 126/85 | HR 75 | Temp 98.4°F | Resp 16 | Wt 163.8 lb

## 2016-10-23 DIAGNOSIS — Z23 Encounter for immunization: Secondary | ICD-10-CM

## 2016-10-23 DIAGNOSIS — Z86718 Personal history of other venous thrombosis and embolism: Secondary | ICD-10-CM | POA: Insufficient documentation

## 2016-10-23 DIAGNOSIS — M545 Low back pain: Secondary | ICD-10-CM | POA: Insufficient documentation

## 2016-10-23 DIAGNOSIS — Z7982 Long term (current) use of aspirin: Secondary | ICD-10-CM | POA: Insufficient documentation

## 2016-10-23 DIAGNOSIS — I129 Hypertensive chronic kidney disease with stage 1 through stage 4 chronic kidney disease, or unspecified chronic kidney disease: Secondary | ICD-10-CM | POA: Insufficient documentation

## 2016-10-23 DIAGNOSIS — K098 Other cysts of oral region, not elsewhere classified: Secondary | ICD-10-CM | POA: Insufficient documentation

## 2016-10-23 DIAGNOSIS — K219 Gastro-esophageal reflux disease without esophagitis: Secondary | ICD-10-CM | POA: Insufficient documentation

## 2016-10-23 DIAGNOSIS — I10 Essential (primary) hypertension: Secondary | ICD-10-CM

## 2016-10-23 DIAGNOSIS — N83201 Unspecified ovarian cyst, right side: Secondary | ICD-10-CM

## 2016-10-23 MED ORDER — FAMOTIDINE 20 MG PO TABS: 20.0000 mg | ORAL_TABLET | Freq: Two times a day (BID) | ORAL | 2 refills | Status: AC

## 2016-10-23 MED ORDER — HYDROCHLOROTHIAZIDE 25 MG PO TABS: 25.0000 mg | ORAL_TABLET | Freq: Every day | ORAL | 3 refills | Status: AC

## 2016-10-23 MED ORDER — POTASSIUM CHLORIDE ER 10 MEQ PO TBCR: 10.0000 meq | EXTENDED_RELEASE_TABLET | Freq: Every day | ORAL | 3 refills | Status: AC

## 2016-10-23 MED ORDER — DICLOFENAC SODIUM 1 % TD GEL: 2.0000 g | Freq: Four times a day (QID) | TRANSDERMAL | 2 refills | Status: AC

## 2016-10-23 MED FILL — ?FAMOTIDINE 20 MG TABLET: 20 | 30 days supply | Qty: 60 | Fill #0

## 2016-10-23 MED FILL — DICLOFENAC SODIUM 1% GEL: 1 | 12 days supply | Qty: 100 | Fill #0

## 2016-10-23 NOTE — Patient Instructions (Addendum)
Gastroesophageal Reflux Disease, Adult Normally, food travels down the esophagus and stays in the stomach to be digested. If a person has gastroesophageal reflux disease (GERD), food and stomach acid move back up into the esophagus. When this happens, the esophagus becomes sore and swollen (inflamed). Over time, GERD can make small holes (ulcers) in the lining of the esophagus. HOME CARE Diet  Follow a diet as told by your doctor. You may need to avoid foods and drinks such as:  Coffee and tea (with or without caffeine).  Drinks that contain alcohol.  Energy drinks and sports drinks.  Carbonated drinks or sodas.  Chocolate and cocoa.  Peppermint and mint flavorings.  Garlic and onions.  Horseradish.  Spicy and acidic foods, such as peppers, chili powder, curry powder, vinegar, hot sauces, and BBQ sauce.  Citrus fruit juices and citrus fruits, such as oranges, lemons, and limes.  Tomato-based foods, such as red sauce, chili, salsa, and pizza with red sauce.  Fried and fatty foods, such as donuts, french fries, potato chips, and high-fat dressings.  High-fat meats, such as hot dogs, rib eye steak, sausage, ham, and bacon.  High-fat dairy items, such as whole milk, butter, and cream cheese.  Eat small meals often. Avoid eating large meals.  Avoid drinking large amounts of liquid with your meals.  Avoid eating meals during the 2-3 hours before bedtime.  Avoid lying down right after you eat.  Do not exercise right after you eat. General Instructions  Pay attention to any changes in your symptoms.  Take over-the-counter and prescription medicines only as told by your doctor. Do not take aspirin, ibuprofen, or other NSAIDs unless your doctor says it is okay.  Do not use any tobacco products, including cigarettes, chewing tobacco, and e-cigarettes. If you need help quitting, ask your doctor.  Wear loose clothes. Do not wear anything tight around your waist.  Raise  (elevate) the head of your bed about 6 inches (15 cm).  Try to lower your stress. If you need help doing this, ask your doctor.  If you are overweight, lose an amount of weight that is healthy for you. Ask your doctor about a safe weight loss goal.  Keep all follow-up visits as told by your doctor. This is important. GET HELP IF:  You have new symptoms.  You lose weight and you do not know why it is happening.  You have trouble swallowing, or it hurts to swallow.  You have wheezing or a cough that keeps happening.  Your symptoms do not get better with treatment.  You have a hoarse voice. GET HELP RIGHT AWAY IF:  You have pain in your arms, neck, jaw, teeth, or back.  You feel sweaty, dizzy, or light-headed.  You have chest pain or shortness of breath.  You throw up (vomit) and your throw up looks like blood or coffee grounds.  You pass out (faint).  Your poop (stool) is bloody or black.  You cannot swallow, drink, or eat.   This information is not intended to replace advice given to you by your health care provider. Make sure you discuss any questions you have with your health care provider.   Document Released: 05/22/2008 Document Revised: 08/25/2015 Document Reviewed: 03/31/2015 Elsevier Interactive Patient Education 2016 [place]  Thoracic Strain Thoracic strain is an injury to the muscles or tendons that attach to the upper back. A strain can be mild or severe. A mild strain may take only 1-2 weeks to  heal. A severe strain involves torn muscles or tendons, so it may take 6-8 weeks to heal. HOME CARE  Rest as needed. Limit your activity as told by your doctor.  If directed, put ice on the injured area:  Put ice in a plastic bag.  Place a towel between your skin and the bag.  Leave the ice on for 20 minutes, 2-3 times per day.  Take over-the-counter and prescription medicines only as told by your doctor.  Begin doing exercises as told by your  doctor or physical therapist.  Warm up before being active.  Bend your knees before you lift heavy objects.  Keep all follow-up visits as told by your doctor. This is important. GET HELP IF:  Your pain is not helped by medicine.  Your pain, bruising, or swelling is getting worse.  You have a fever. GET HELP RIGHT AWAY IF:  You have shortness of breath.  You have chest pain.  You have weakness or loss of feeling (numbness) in your legs.  You cannot control when you pee (urinate).   This information is not intended to replace advice given to you by your health care provider. Make sure you discuss any questions you have with your health care provider.   Document Released: 05/22/2008 Document Revised: 08/25/2015 Document Reviewed: 01/28/2015 Elsevier Interactive Patient Education 2016 [employer] Inc[employer]   -  Back Exercises If you have pain in your back, do these exercises 2-3 times each day or as told by your doctor. When the pain goes away, do the exercises once each day, but repeat the steps more times for each exercise (do more repetitions). If you do not have pain in your back, do these exercises once each day or as told by your doctor. EXERCISES Single Knee to Chest Do these steps 3-5 times in a row for each leg: 1. Lie on your back on a firm bed or the floor with your legs stretched out. 2. Bring one knee to your chest. 3. Hold your knee to your chest by grabbing your knee or thigh. 4. Pull on your knee until you feel a gentle stretch in your lower back. 5. Keep doing the stretch for 10-30 seconds. 6. Slowly let go of your leg and straighten it. Pelvic Tilt Do these steps 5-10 times in a row: 1. Lie on your back on a firm bed or the floor with your legs stretched out. 2. Bend your knees so they point up to the ceiling. Your feet should be flat on the floor. 3. Tighten your lower belly (abdomen) muscles to press your lower back against the floor. This will make your  tailbone point up to the ceiling instead of pointing down to your feet or the floor. 4. Stay in this position for 5-10 seconds while you gently tighten your muscles and breathe evenly. Cat-Cow Do these steps until your lower back bends more easily: 1. Get on your hands and knees on a firm surface. Keep your hands under your shoulders, and keep your knees under your hips. You may put padding under your knees. 2. Let your head hang down, and make your tailbone point down to the floor so your lower back is round like the back of a cat. 3. Stay in this position for 5 seconds. 4. Slowly lift your head and make your tailbone point up to the ceiling so your back hangs low (sags) like the back of a cow. 5. Stay in this position for 5 seconds. Press-Ups Do  these steps 5-10 times in a row: 1. Lie on your belly (face-down) on the floor. 2. Place your hands near your head, about shoulder-width apart. 3. While you keep your back relaxed and keep your hips on the floor, slowly straighten your arms to raise the top half of your body and lift your shoulders. Do not use your back muscles. To make yourself more comfortable, you may change where you place your hands. 4. Stay in this position for 5 seconds. 5. Slowly return to lying flat on the floor. Bridges Do these steps 10 times in a row: 1. Lie on your back on a firm surface. 2. Bend your knees so they point up to the ceiling. Your feet should be flat on the floor. 3. Tighten your butt muscles and lift your butt off of the floor until your waist is almost as high as your knees. If you do not feel the muscles working in your butt and the back of your thighs, slide your feet 1-2 inches farther away from your butt. 4. Stay in this position for 3-5 seconds. 5. Slowly lower your butt to the floor, and let your butt muscles relax. If this exercise is too easy, try doing it with your arms crossed over your chest. Belly Crunches Do these steps 5-10 times in a  row: 1. Lie on your back on a firm bed or the floor with your legs stretched out. 2. Bend your knees so they point up to the ceiling. Your feet should be flat on the floor. 3. Cross your arms over your chest. 4. Tip your chin a little bit toward your chest but do not bend your neck. 5. Tighten your belly muscles and slowly raise your chest just enough to lift your shoulder blades a tiny bit off of the floor. 6. Slowly lower your chest and your head to the floor. Back Lifts Do these steps 5-10 times in a row: 1. Lie on your belly (face-down) with your arms at your sides, and rest your forehead on the floor. 2. Tighten the muscles in your legs and your butt. 3. Slowly lift your chest off of the floor while you keep your hips on the floor. Keep the back of your head in line with the curve in your back. Look at the floor while you do this. 4. Stay in this position for 3-5 seconds. 5. Slowly lower your chest and your face to the floor. GET HELP IF:  Your back pain gets a lot worse when you do an exercise.  Your back pain does not lessen 2 hours after you exercise. If you have any of these problems, stop doing the exercises. Do not do them again unless your doctor says it is okay. GET HELP RIGHT AWAY IF:  You have sudden, very bad back pain. If this happens, stop doing the exercises. Do not do them again unless your doctor says it is okay.   This information is not intended to replace advice given to you by your health care provider. Make sure you discuss any questions you have with your health care provider.   Document Released: 01/06/2011 Document Revised: 08/25/2015 Document Reviewed: 01/28/2015 Elsevier Interactive Patient Education 2016 Elsevier Inc.    Influenza Virus Vaccine injection (Fluarix) What is this medicine? INFLUENZA VIRUS VACCINE (in floo EN zuh VAHY ruhs vak SEEN) helps to reduce the risk of getting influenza also known as the flu. This medicine may be used for other  purposes; ask your health care  provider or pharmacist if you have questions. What should I tell my health care provider before I take this medicine? They need to know if you have any of these conditions: -bleeding disorder like hemophilia -fever or infection -Guillain-Barre syndrome or other neurological problems -immune system problems -infection with the human immunodeficiency virus (HIV) or AIDS -low blood platelet counts -multiple sclerosis -an unusual or allergic reaction to influenza virus vaccine, eggs, chicken proteins, latex, gentamicin, other medicines, foods, dyes or preservatives -pregnant or trying to get pregnant -breast-feeding How should I use this medicine? This vaccine is for injection into a muscle. It is given by a health care professional. A copy of Vaccine Information Statements will be given before each vaccination. Read this sheet carefully each time. The sheet may change frequently. Talk to your pediatrician regarding the use of this medicine in children. Special care may be needed. Overdosage: If you think you have taken too much of this medicine contact a poison control center or emergency room at once. NOTE: This medicine is only for you. Do not share this medicine with others. What if I miss a dose? This does not apply. What may interact with this medicine? -chemotherapy or radiation therapy -medicines that lower your immune system like etanercept, anakinra, infliximab, and adalimumab -medicines that treat or prevent blood clots like warfarin -phenytoin -steroid medicines like prednisone or cortisone -theophylline -vaccines This list may not describe all possible interactions. Give your health care provider a list of all the medicines, herbs, non-prescription drugs, or dietary supplements you use. Also tell them if you smoke, drink alcohol, or use illegal drugs. Some items may interact with your medicine. What should I watch for while using this  medicine? Report any side effects that do not go away within 3 days to your doctor or health care professional. Call your health care provider if any unusual symptoms occur within 6 weeks of receiving this vaccine. You may still catch the flu, but the illness is not usually as bad. You cannot get the flu from the vaccine. The vaccine will not protect against colds or other illnesses that may cause fever. The vaccine is needed every year. What side effects may I notice from receiving this medicine? Side effects that you should report to your doctor or health care professional as soon as possible: -allergic reactions like skin rash, itching or hives, swelling of the face, lips, or tongue Side effects that usually do not require medical attention (report to your doctor or health care professional if they continue or are bothersome): -fever -headache -muscle aches and pains -pain, tenderness, redness, or swelling at site where injected -weak or tired This list may not describe all possible side effects. Call your doctor for medical advice about side effects. You may report side effects to FDA at 1-800-FDA-1088. Where should I keep my medicine? This vaccine is only given in a clinic, pharmacy, doctor's office, or other health care setting and will not be stored at home. NOTE: This sheet is a summary. It may not cover all possible information. If you have questions about this medicine, talk to your doctor, pharmacist, or health care provider.    2016, Elsevier/Gold Standard. (2008-07-01 09:30:40)

## 2016-10-23 NOTE — Progress Notes (Signed)
Pt is in the office today for a 3 month follow up on hypertension Pt states her pain level today in the office is a 4 Pt states her pain is coming from her back Pt states she is not taking anything for her back

## 2016-10-23 NOTE — Progress Notes (Signed)
Kirsten Edwards, is a 50 y.o. female  [id number]  [id number]  DOB - Oct 09, 1966  Chief Complaint  Patient presents with  . Hypertension        Subjective:   Kirsten Edwards is a 50 y.o. female here today for a follow up visit., last seen  07/11/16, here w/ family who is helping to translate.  Overall, pt doing well. Has never heard from OB re: the dermoid cyst f/u.  gerd controlled on ppi, except when eats tomoatoes.   C/o of mild lower back pain occasionally as well. Nonradiating.  Leg cramps resolved since starting Kdur.   Patient has No headache, No chest pain, No abdominal pain - No Nausea, No new weakness tingling or numbness, No Cough - SOB.  No problems updated.  ALLERGIES: No Known Allergies  PAST MEDICAL HISTORY: Past Medical History:  Diagnosis Date  . DVT (deep venous thrombosis) ([place]) 9/13   left leg    MEDICATIONS AT HOME: Prior to Admission medications   Medication Sig Start Date End Date Taking? Authorizing Provider  ASPIRIN PO Take 325 mg by mouth daily.    Yes Historical Provider, MD  hydrochlorothiazide (HYDRODIURIL) 25 MG tablet Take 1 tablet (25 mg total) by mouth daily. 10/23/16  Yes Maren Reamer, MD  potassium chloride (K-DUR) 10 MEQ tablet Take 1 tablet (10 mEq total) by mouth daily. 10/23/16  Yes Maren Reamer, MD  diclofenac sodium (VOLTAREN) 1 % GEL Apply 2 g topically 4 (four) times daily. 10/23/16   Maren Reamer, MD  famotidine (PEPCID) 20 MG tablet Take 1 tablet (20 mg total) by mouth 2 (two) times daily. 10/23/16 10/23/17  Maren Reamer, MD     Objective:   Vitals:   10/23/16 1530  BP: 126/85  Pulse: 75  Resp: 16  Temp: 98.4 F (36.9 C)  TempSrc: Oral  SpO2: 98%  Weight: 163 lb 12.8 oz (74.3 kg)    Exam General appearance : Awake, alert, not in any distress. Speech Clear. Not toxic looking HEENT: Atraumatic and Normocephalic, pupils equally reactive to light. bilat Tms.  Upper Palate cyst noted (pt states  sister has it too) Neck: supple, no JVD. No cervical lymphadenopathy.  Chest:Good air entry bilaterally, no added sounds. CVS: S1 S2 regular, no murmurs/gallups or rubs. Abdomen: Bowel sounds active, Non tender and not distended with no gaurding, rigidity or rebound. Extremities: B/L Lower Ext shows no edema, both legs are warm to touch.  Mild ttp lower lumbar region to palpation, no sciatica/no radiation. Neg straight leg test. Neurology: Awake alert, and oriented X 3, CN II-XII grossly intact, Non focal Skin:No Rash  Data Review No results found for: HGBA1C  Depression screen PHQ 2/9 07/11/2016  Decreased Interest 3  Down, Depressed, Hopeless 1  PHQ - 2 Score 4  Altered sleeping 2  Tired, decreased energy 2  Change in appetite 1  Feeling bad or failure about yourself  1  Trouble concentrating 0  Moving slowly or fidgety/restless 2  Suicidal thoughts 0  PHQ-9 Score 12      Assessment & Plan   1. Cyst of right ovary - still has not seen ob, I am not certain why not. Review of referral notes, ob was to see, somehow appt never made, and referral canceled due to expiration. I replaced today. - Ambulatory referral to Obstetrics / Gynecology  2. HTN (hypertension), benign - controlled on hctz, renewed kdur as well - had muscle cramps prior to kdur due to  hypokalemia - BASIC METABOLIC PANEL WITH GFR  3. 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 - Ambulatory referral to Dentistry  4. Back strain, lower back - nonradiating, recd back exercisers/stretches, warm heat, - prn voltaren gel  5. gerd - controlled w/ ppi, d/w pt long term concerns w/ ppi and correlation w/ ckd. Recd trial switch to pepcid bid, ordered, and tighter GERD diet. - pt and family shocked that tomatoes/sauce worsens gerd, but than after further discussion they realized that every time ate (which they eat it a lot), would have bad eso pains.  6. Language barrier -   Patient have  been counseled extensively about nutrition and exercise  Return in about 3 months (around 01/23/2017).  The patient was given clear instructions to go to ER or return to medical center if symptoms don't improve, worsen or new problems develop. The patient verbalized understanding. The patient was told to call to get lab results if they haven't heard anything in the next week.   This note has been created with Surveyor, quantity. Any transcriptional errors are unintentional.   Maren Reamer, MD, [facility] [place], [place]   10/23/2016, 4:01 PM

## 2016-10-24 LAB — BASIC METABOLIC PANEL WITH GFR
BUN: 12 mg/dL (ref 7–25)
CALCIUM: 9.8 mg/dL (ref 8.6–10.2)
CO2: 27 mmol/L (ref 20–31)
Chloride: 104 mmol/L (ref 98–110)
Creat: 0.91 mg/dL (ref 0.50–1.10)
GFR, EST AFRICAN AMERICAN: 86 mL/min (ref >=60)
GFR, EST NON AFRICAN AMERICAN: 75 mL/min (ref >=60)
GLUCOSE: 85 mg/dL (ref 65–99)
Potassium: 3.7 mmol/L (ref 3.5–5.3)
Sodium: 140 mmol/L (ref 135–146)

## 2016-10-27 ENCOUNTER — Telehealth: Payer: Self-pay

## 2016-10-27 NOTE — Telephone Encounter (Signed)
[place]: [id number] contacted pt to go over lab results pt didn't answer and was unable to lvm

## 2016-10-31 MED FILL — HYDROCHLOROTHIAZIDE 25 MG T: 25 | 30 days supply | Qty: 30 | Fill #3

## 2016-10-31 MED FILL — POTASSIUM CL 10 MEQ TAB SA: 10 | 30 days supply | Qty: 30 | Fill #3

## 2016-12-18 IMAGING — US US TRANSVAGINAL NON-OB
1 series · 14 of 25 positions shown · non-contrast
Comparison: 05/18/2014

CLINICAL DATA: Intermittent bilateral pelvic pain x3 years, history
of right adnexal mass

EXAM:
TRANSABDOMINAL AND TRANSVAGINAL ULTRASOUND OF PELVIS
TECHNIQUE: Both transabdominal and transvaginal ultrasound examinations of the
pelvis were performed. Transabdominal technique was performed for
global imaging of the pelvis including uterus, ovaries, adnexal
regions, and pelvic cul-de-sac. It was necessary to proceed with
endovaginal exam following the transabdominal exam to visualize the
left ovary.

[Series 1: us transvaginal non-ob · 14 of 50 slices shown]
[im 1/50]
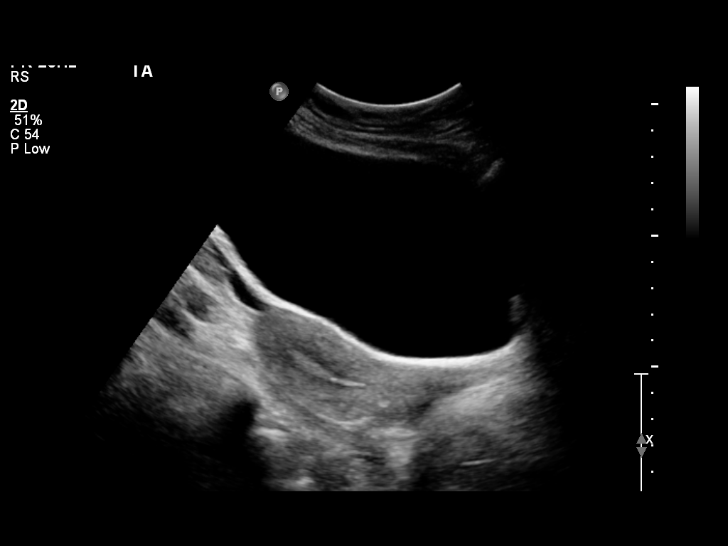
[im 5/50]
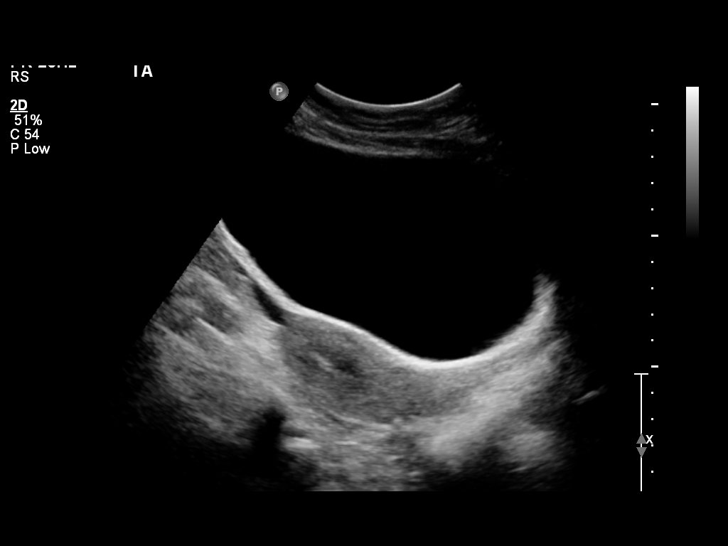
[im 9/50]
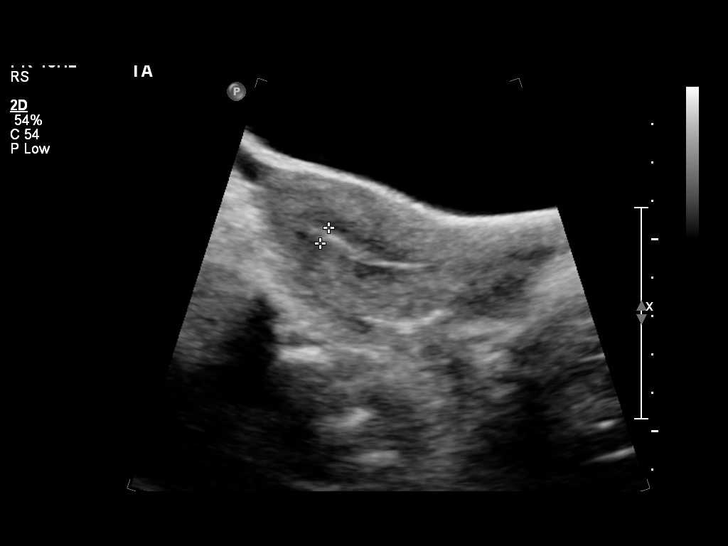
[im 13/50]
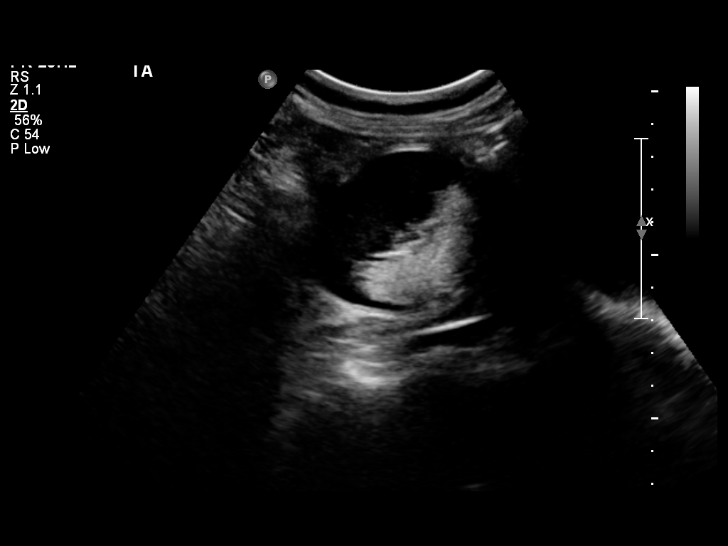
[im 17/50]
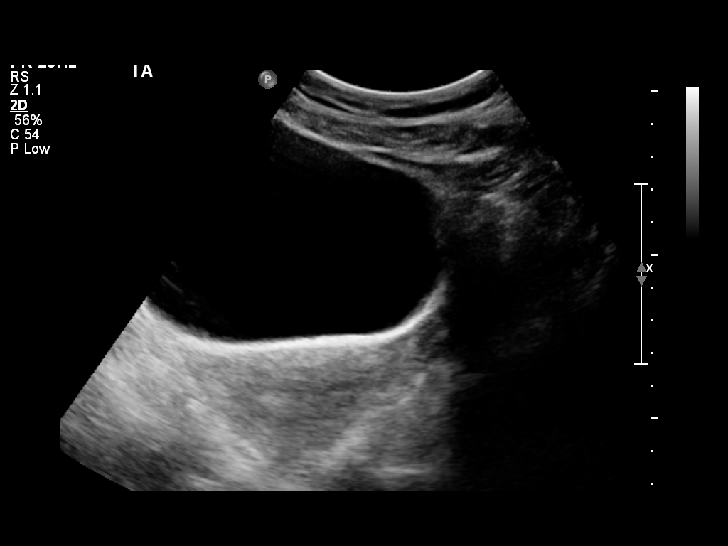
[im 19/50]
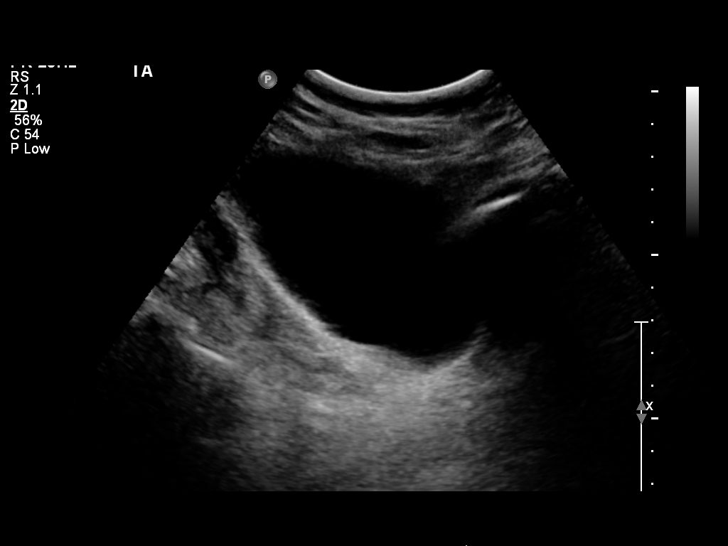
[im 23/50]
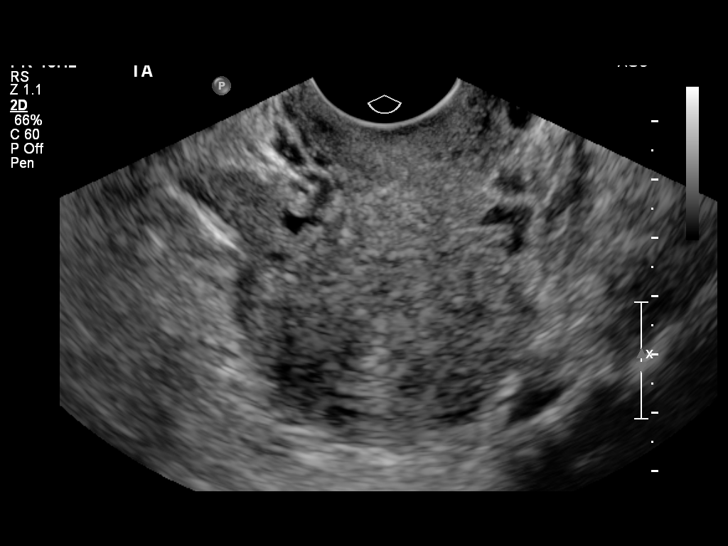
[im 27/50]
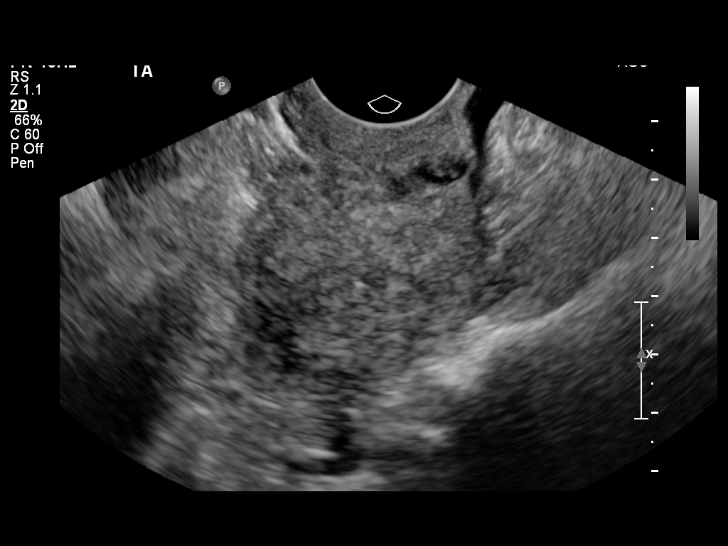
[im 31/50]
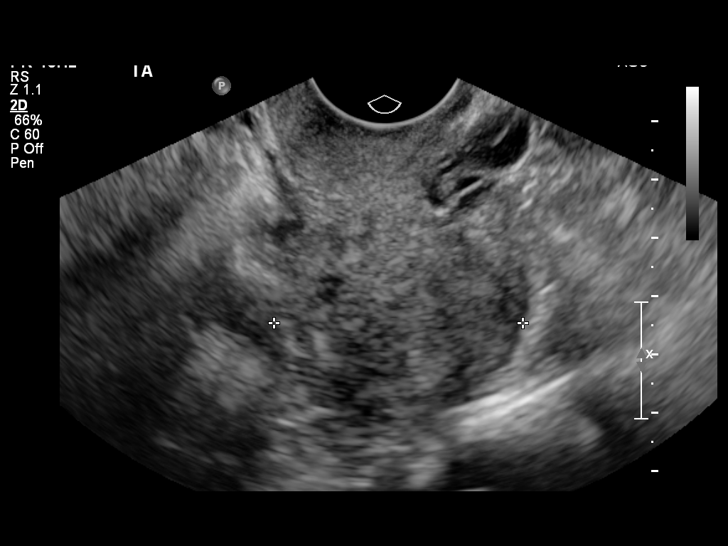
[im 33/50]
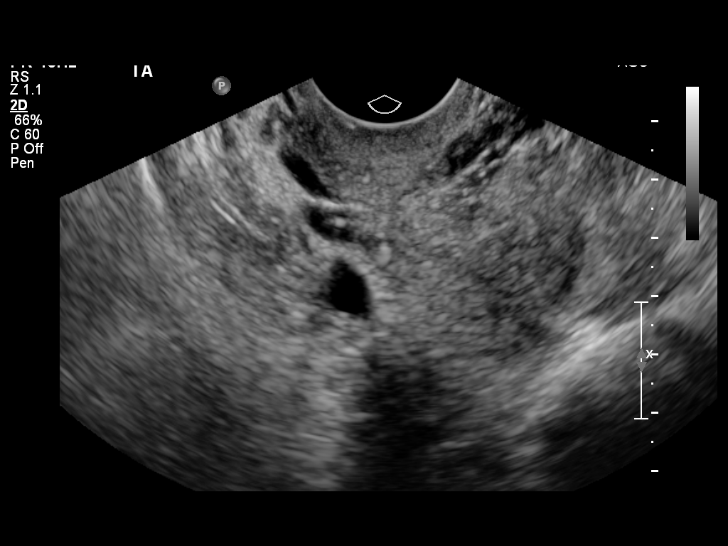
[im 37/50]
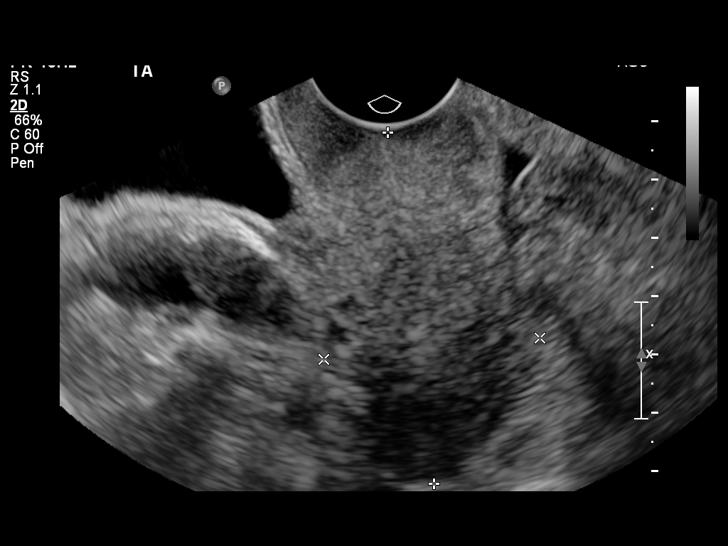
[im 41/50]
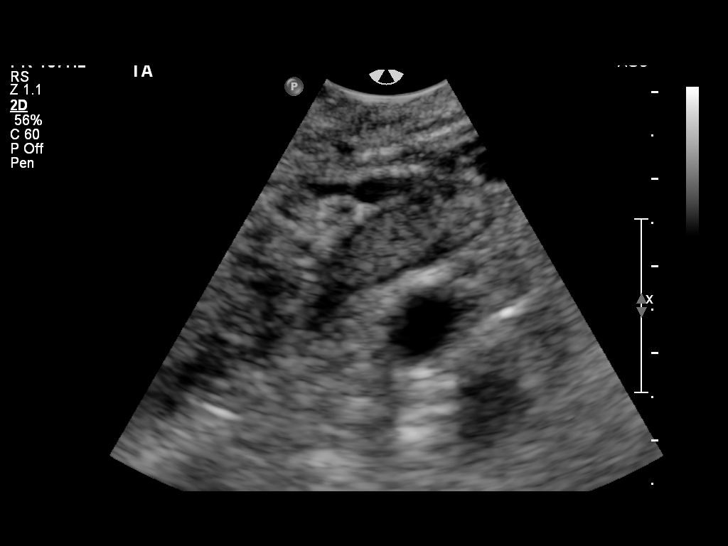
[im 45/50]
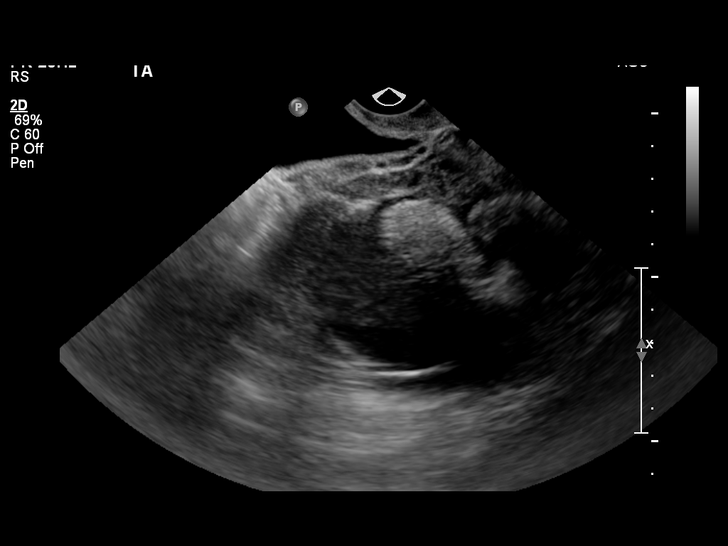
[im 50/50]
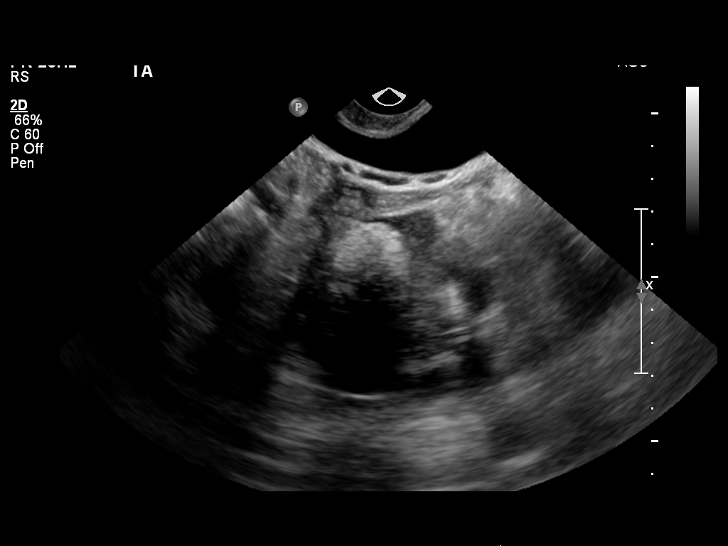

[14 of 25 positions shown; findings below may reference images not displayed]

FINDINGS: Uterus

Measurements: 7.6 x 2.9 x 4.3 cm. No fibroids or other mass
visualized.

Endometrium

Thickness: 5 mm.  No focal abnormality visualized.

Right ovary

5.4 x 5.0 x 6.2 cm solid right adnexal mass with echogenic
component, compatible with a right ovarian dermoid. Distinct right
ovarian tissue separate from the mass is not definitely visualized.

Left ovary

Measurements: 2.1 x 1.0 x 1.4 cm. Normal appearance/no adnexal mass.

Other findings

No free fluid.
IMPRESSION: 6.2 cm right ovarian dermoid, stable versus mildly decreased.

## 2017-02-01 ENCOUNTER — Ambulatory Visit: Payer: Self-pay | Admitting: Obstetrics & Gynecology

## 2017-05-03 ENCOUNTER — Encounter: Payer: Self-pay | Admitting: Internal Medicine

## 2017-05-04 ENCOUNTER — Encounter: Payer: Self-pay | Admitting: Internal Medicine

## 2017-05-07 ENCOUNTER — Encounter: Payer: Self-pay | Admitting: Internal Medicine
